# Patient Record
Sex: Female | Born: 1940 | Race: White | Hispanic: No | Smoking: Never smoker
Health system: Southern US, Community
[De-identification: ages and names within clinical notes are randomized; demographics above are authoritative.]

## PROBLEM LIST (undated history)

## (undated) DIAGNOSIS — K219 Gastro-esophageal reflux disease without esophagitis: Secondary | ICD-10-CM

## (undated) DIAGNOSIS — E7800 Pure hypercholesterolemia, unspecified: Secondary | ICD-10-CM

## (undated) DIAGNOSIS — I10 Essential (primary) hypertension: Secondary | ICD-10-CM

## (undated) DIAGNOSIS — M797 Fibromyalgia: Secondary | ICD-10-CM

## (undated) DIAGNOSIS — E119 Type 2 diabetes mellitus without complications: Secondary | ICD-10-CM

## (undated) DIAGNOSIS — R195 Other fecal abnormalities: Secondary | ICD-10-CM

## (undated) HISTORY — DX: Essential (primary) hypertension: I10

## (undated) HISTORY — DX: Fibromyalgia: M79.7

## (undated) HISTORY — PX: CARPAL TUNNEL RELEASE: SHX101

## (undated) HISTORY — DX: Gastro-esophageal reflux disease without esophagitis: K21.9

## (undated) HISTORY — PX: KNEE ARTHROSCOPY: SUR90

## (undated) HISTORY — PX: OTHER SURGICAL HISTORY: SHX169

## (undated) HISTORY — PX: NASAL FRACTURE SURGERY: SHX718

## (undated) HISTORY — DX: Type 2 diabetes mellitus without complications: E11.9

## (undated) HISTORY — PX: TUBAL LIGATION: SHX77

## (undated) HISTORY — DX: Pure hypercholesterolemia, unspecified: E78.00

## (undated) HISTORY — PX: LUMBAR EPIDURAL INJECTION: SHX1980

## (undated) HISTORY — PX: NECK SURGERY: SHX720

## (undated) HISTORY — DX: Other fecal abnormalities: R19.5

---

## 2015-07-14 ENCOUNTER — Encounter (INDEPENDENT_AMBULATORY_CARE_PROVIDER_SITE_OTHER): Payer: Self-pay | Admitting: *Deleted

## 2015-08-18 ENCOUNTER — Encounter (INDEPENDENT_AMBULATORY_CARE_PROVIDER_SITE_OTHER): Payer: Self-pay | Admitting: Internal Medicine

## 2015-08-18 ENCOUNTER — Telehealth (INDEPENDENT_AMBULATORY_CARE_PROVIDER_SITE_OTHER): Payer: Self-pay | Admitting: *Deleted

## 2015-08-18 ENCOUNTER — Encounter (INDEPENDENT_AMBULATORY_CARE_PROVIDER_SITE_OTHER): Payer: Self-pay | Admitting: *Deleted

## 2015-08-18 ENCOUNTER — Other Ambulatory Visit (INDEPENDENT_AMBULATORY_CARE_PROVIDER_SITE_OTHER): Payer: Self-pay | Admitting: Internal Medicine

## 2015-08-18 ENCOUNTER — Ambulatory Visit (INDEPENDENT_AMBULATORY_CARE_PROVIDER_SITE_OTHER): Payer: Medicare Other | Admitting: Internal Medicine

## 2015-08-18 VITALS — BP 150/80 | HR 80 | Temp 98.0°F | Ht 65.0 in | Wt 192.9 lb

## 2015-08-18 DIAGNOSIS — R197 Diarrhea, unspecified: Secondary | ICD-10-CM

## 2015-08-18 DIAGNOSIS — M797 Fibromyalgia: Secondary | ICD-10-CM | POA: Insufficient documentation

## 2015-08-18 DIAGNOSIS — I10 Essential (primary) hypertension: Secondary | ICD-10-CM | POA: Insufficient documentation

## 2015-08-18 DIAGNOSIS — M1990 Unspecified osteoarthritis, unspecified site: Secondary | ICD-10-CM | POA: Insufficient documentation

## 2015-08-18 DIAGNOSIS — K219 Gastro-esophageal reflux disease without esophagitis: Secondary | ICD-10-CM | POA: Diagnosis not present

## 2015-08-18 DIAGNOSIS — K589 Irritable bowel syndrome without diarrhea: Secondary | ICD-10-CM | POA: Diagnosis not present

## 2015-08-18 DIAGNOSIS — E7800 Pure hypercholesterolemia, unspecified: Secondary | ICD-10-CM | POA: Insufficient documentation

## 2015-08-18 DIAGNOSIS — E119 Type 2 diabetes mellitus without complications: Secondary | ICD-10-CM | POA: Insufficient documentation

## 2015-08-18 DIAGNOSIS — R1310 Dysphagia, unspecified: Secondary | ICD-10-CM

## 2015-08-18 DIAGNOSIS — E1159 Type 2 diabetes mellitus with other circulatory complications: Secondary | ICD-10-CM | POA: Insufficient documentation

## 2015-08-18 MED ORDER — DICYCLOMINE HCL 10 MG PO CAPS: 10.0000 mg | ORAL_CAPSULE | Freq: Two times a day (BID) | ORAL | Status: DC

## 2015-08-18 MED ORDER — OMEPRAZOLE 40 MG PO CPDR: 40.0000 mg | DELAYED_RELEASE_CAPSULE | Freq: Every day | ORAL | Status: DC

## 2015-08-18 NOTE — Progress Notes (Addendum)
   Subjective:    Patient ID: Theresa Hernandez, female    DOB: 1940-10-08, 74 y.o.   MRN: [id number]  HPI Referred to our office by Dr. Harmon Pier for diarrhea. She says she has to go to the [facility] to have a BM as soon as she eats. Stools are loose. Symptoms x 6 months.  She will have lower abdominal pain at times. She says sometimes she has to bend over it hurts so bad. The pain lasts a short time. He last colonoscopy was 5 yrs ago by Dr. West Carbo and she says it was normal.  Her appetite is good. Sometimes she says foods feel like they are lodging.  Dysphagia occurs on a daily basis. She has to eat slow. She does have acid reflux and takes Omeprazole for this. She says it helps some.  She usually has a BM 2-3 stools a day. She was on an antibiotic 1 1/2 weeks ago for a dental procedure.   Diabetic x 15 yrs. Blood sugars usually run 140 daily. Has been on Metformin for years.      Review of Systems      Past Medical History  Diagnosis Date  . Hypertension   . High cholesterol   . GERD (gastroesophageal reflux disease)   . Diabetes ([place])   . Fibromyalgia     Past Surgical History  Procedure Laterality Date  . Nasal fracture surgery    . Neck surgery    . Bust reduction    . Carpal tunnel release      both  . Knee arthroscopy    . Tubal ligation      No Known Allergies  No current outpatient prescriptions on file prior to visit.   No current facility-administered medications on file prior to visit.        Objective:   Physical Exam Blood pressure 150/80, pulse 80, temperature 98 F (36.7 C), height 5\' 5"  (1.651 m), weight 192 lb 14.4 oz (87.499 kg).  Alert and oriented. Skin warm and dry. Oral mucosa is moist.   . Sclera anicteric, conjunctivae is pink. Thyroid not enlarged. No cervical lymphadenopathy. Lungs clear. Heart regular rate and rhythm.  Abdomen is soft. Bowel sounds are positive. No hepatomegaly. No abdominal masses felt. No tenderness.  No edema to lower  extremities.         Assessment & Plan:  Diarrhea, urgency.  GERD/Dysphagia. Continue the Omeprazole. EGD/ED to rule out PUD and Stricture. CBC, CMET, GI pathogen. Rx for Dicyclomine 10mg  BID.  Will get colonoscopy report from Dr. Anastasio Auerbach office.  Will get colonoscopy report from Dr. Anastasio Auerbach office.   Rx

## 2015-08-18 NOTE — Patient Instructions (Signed)
EGD/ED. The risks and benefits such as perforation, bleeding, and infection were reviewed with the patient and is agreeable. 

## 2015-08-18 NOTE — Telephone Encounter (Signed)
.  Per Lelon Perla patient is to have labs drawn along with GI Pathogen.

## 2015-08-19 ENCOUNTER — Telehealth (INDEPENDENT_AMBULATORY_CARE_PROVIDER_SITE_OTHER): Payer: Self-pay | Admitting: Internal Medicine

## 2015-08-19 LAB — COMPREHENSIVE METABOLIC PANEL
ALT: 13 U/L (ref 6–29)
AST: 16 U/L (ref 10–35)
Albumin: 4.3 g/dL (ref 3.6–5.1)
Alkaline Phosphatase: 76 U/L (ref 33–130)
BUN: 16 mg/dL (ref 7–25)
CO2: 23 mmol/L (ref 20–31)
Calcium: 9.3 mg/dL (ref 8.6–10.4)
Chloride: 102 mmol/L (ref 98–110)
Creat: 0.83 mg/dL (ref 0.60–0.93)
GLUCOSE: 136 mg/dL — AB (ref 65–99)
Potassium: 4 mmol/L (ref 3.5–5.3)
SODIUM: 137 mmol/L (ref 135–146)
Total Bilirubin: 0.4 mg/dL (ref 0.2–1.2)
Total Protein: 6.7 g/dL (ref 6.1–8.1)

## 2015-08-19 LAB — CBC
HCT: 38.1 % (ref 36.0–46.0)
Hemoglobin: 12.4 g/dL (ref 12.0–15.0)
MCH: 27.1 pg (ref 26.0–34.0)
MCHC: 32.5 g/dL (ref 30.0–36.0)
MCV: 83.2 fL (ref 78.0–100.0)
MPV: 12.4 fL (ref 8.6–12.4)
PLATELETS: 285 10*3/uL (ref 150–400)
RBC: 4.58 MIL/uL (ref 3.87–5.11)
RDW: 14.5 % (ref 11.5–15.5)
WBC: 7.9 10*3/uL (ref 4.0–10.5)

## 2015-08-19 NOTE — Telephone Encounter (Signed)
Can not get it without patient signing a release. She left and one was not gotten.

## 2015-08-19 NOTE — Telephone Encounter (Signed)
Theresa Hernandez, did we get colonoscopy from Dr. West Carbo. If not, could u see if we can get it

## 2015-08-24 LAB — GASTROINTESTINAL PATHOGEN PANEL PCR
C. DIFFICILE TOX A/B, PCR: NOT DETECTED
CRYPTOSPORIDIUM, PCR: NOT DETECTED
Campylobacter, PCR: NOT DETECTED
E COLI (ETEC) LT/ST, PCR: NOT DETECTED
E COLI (STEC) STX1/STX2, PCR: NOT DETECTED
E COLI 0157, PCR: NOT DETECTED
GIARDIA LAMBLIA, PCR: NOT DETECTED
NOROVIRUS, PCR: NOT DETECTED
Rotavirus A, PCR: NOT DETECTED
Salmonella, PCR: NOT DETECTED
Shigella, PCR: NOT DETECTED

## 2015-09-05 ENCOUNTER — Ambulatory Visit (HOSPITAL_COMMUNITY)
Admission: RE | Admit: 2015-09-05 | Discharge: 2015-09-05 | Disposition: A | Discharge status: Home / Self Care | Payer: Medicare Other | Source: Ambulatory Visit | Attending: Internal Medicine | Admitting: Internal Medicine

## 2015-09-05 ENCOUNTER — Encounter (HOSPITAL_COMMUNITY)
Admission: RE | Disposition: A | Discharge status: Home / Self Care | Payer: Self-pay | Source: Ambulatory Visit | Attending: Internal Medicine

## 2015-09-05 ENCOUNTER — Encounter (HOSPITAL_COMMUNITY): Payer: Self-pay

## 2015-09-05 DIAGNOSIS — K219 Gastro-esophageal reflux disease without esophagitis: Secondary | ICD-10-CM | POA: Insufficient documentation

## 2015-09-05 DIAGNOSIS — K2960 Other gastritis without bleeding: Secondary | ICD-10-CM | POA: Insufficient documentation

## 2015-09-05 DIAGNOSIS — M797 Fibromyalgia: Secondary | ICD-10-CM | POA: Insufficient documentation

## 2015-09-05 DIAGNOSIS — R1310 Dysphagia, unspecified: Secondary | ICD-10-CM | POA: Diagnosis not present

## 2015-09-05 DIAGNOSIS — E7800 Pure hypercholesterolemia, unspecified: Secondary | ICD-10-CM | POA: Insufficient documentation

## 2015-09-05 DIAGNOSIS — I10 Essential (primary) hypertension: Secondary | ICD-10-CM | POA: Insufficient documentation

## 2015-09-05 DIAGNOSIS — Z794 Long term (current) use of insulin: Secondary | ICD-10-CM | POA: Insufficient documentation

## 2015-09-05 DIAGNOSIS — K317 Polyp of stomach and duodenum: Secondary | ICD-10-CM | POA: Diagnosis not present

## 2015-09-05 DIAGNOSIS — E119 Type 2 diabetes mellitus without complications: Secondary | ICD-10-CM | POA: Diagnosis not present

## 2015-09-05 DIAGNOSIS — K2950 Unspecified chronic gastritis without bleeding: Secondary | ICD-10-CM | POA: Diagnosis not present

## 2015-09-05 DIAGNOSIS — Z7982 Long term (current) use of aspirin: Secondary | ICD-10-CM | POA: Diagnosis not present

## 2015-09-05 DIAGNOSIS — Z79899 Other long term (current) drug therapy: Secondary | ICD-10-CM | POA: Diagnosis not present

## 2015-09-05 HISTORY — PX: ESOPHAGOGASTRODUODENOSCOPY: SHX5428

## 2015-09-05 HISTORY — PX: ESOPHAGEAL DILATION: SHX303

## 2015-09-05 LAB — GLUCOSE, CAPILLARY: Glucose-Capillary: 88 mg/dL (ref 65–99)

## 2015-09-05 SURGERY — EGD (ESOPHAGOGASTRODUODENOSCOPY)
Anesthesia: Moderate Sedation

## 2015-09-05 MED ORDER — SODIUM CHLORIDE 0.9 % IV SOLN
INTRAVENOUS | Status: DC
  Administered 2015-09-05: 09:00:00 via INTRAVENOUS

## 2015-09-05 MED ORDER — MIDAZOLAM HCL 5 MG/5ML IJ SOLN
INTRAMUSCULAR | Status: AC
  Filled 2015-09-05: qty 10

## 2015-09-05 MED ORDER — MEPERIDINE HCL 50 MG/ML IJ SOLN
INTRAMUSCULAR | Status: DC | PRN
  Administered 2015-09-05 (×2): 25 mg via INTRAVENOUS

## 2015-09-05 MED ORDER — MEPERIDINE HCL 50 MG/ML IJ SOLN
INTRAMUSCULAR | Status: AC
  Filled 2015-09-05: qty 1

## 2015-09-05 MED ORDER — BUTAMBEN-TETRACAINE-BENZOCAINE 2-2-14 % EX AERO
INHALATION_SPRAY | CUTANEOUS | Status: DC | PRN
  Administered 2015-09-05: 2 via TOPICAL

## 2015-09-05 MED ORDER — STERILE WATER FOR IRRIGATION IR SOLN
Status: DC | PRN
  Administered 2015-09-05: 11:00:00

## 2015-09-05 MED ORDER — MIDAZOLAM HCL 5 MG/5ML IJ SOLN
INTRAMUSCULAR | Status: DC | PRN
  Administered 2015-09-05: 2 mg via INTRAVENOUS
  Administered 2015-09-05: 1 mg via INTRAVENOUS
  Administered 2015-09-05: 2 mg via INTRAVENOUS

## 2015-09-05 NOTE — Op Note (Signed)
EGD PROCEDURE REPORT  PATIENT:  Theresa Hernandez  MR#:  [id number] Birthdate:  [birth year], 74 y.o., female Endoscopist:  Dr. Rogene Houston, MD Referred By:  Dr. Clinton Quant, MD Procedure Date: 09/05/2015  Procedure:   EGD with ED  Indications:   Patient is 74 year old Caucasian female with chronic GERD who presents with 6 month history of dysphagia to solids. She points lower sternum area site of bolus obstruction. She says heartburns well controlled with therapy. She takes 1-3 Aleve tablets per day for arthralgias.            Informed Consent:  The risks, benefits, alternatives & imponderables which include, but are not limited to, bleeding, infection, perforation, drug reaction and potential missed lesion have been reviewed.  The potential for biopsy, lesion removal, esophageal dilation, etc. have also been discussed.  Questions have been answered.  All parties agreeable.  Please see history & physical in medical record for more information.  Medications:  Demerol 50 mg IV Versed 5 mg IV Cetacaine spray topically for oropharyngeal anesthesia  Description of procedure:  The endoscope was introduced through the mouth and advanced to the second portion of the duodenum without difficulty or limitations. The mucosal surfaces were surveyed very carefully during advancement of the scope and upon withdrawal.  Findings:  Esophagus:  Mucosa of the esophagus was normal. GE junction was unremarkable. GEJ:  40 cm Stomach:  Stomach was empty and distended very well with insufflation. Folds in the proximal stomach were normal. Few small hyperplastic appearing polyps noted at gastric body and fundus. These were left alone. Multiple erosions noted involving antral mucosa. Pyloric channel was patent. Angularis was unremarkable. Duodenum:  Normal bulbar and post bulbar mucosa.  Therapeutic/Diagnostic Maneuvers Performed:   Esophagus was dilated by passing 56 [place] Maloney dilator to full  insertion. Esophageal mucosa was reexamined post dilation and no disruption noted.  Complications: None  EBL: None  Impression: No evidence of erosive esophagitis ring or stricture formation. Few small hyperplastic appearing polyps at gastric body and fundus. These were left alone. Erosive antral gastritis. Esophagus dilated by passing 30 French Maloney dilator to full insertion but no disruption noted to esophageal mucosa.  Comment: Since no structural abnormality noted to esophagus she may have esophageal motility disorder and would consider further workup if she remains with dysphagia.  Recommendations:  Standard instructions given. H. pylori serology. Patient advised to keep NSAID use to minimum. I will be contacting patient with results of blood test.  Zakery Normington U  09/05/2015  11:08 AM  CC: Dr. Clinton Quant, MD & Dr. Rayne Du ref. provider found

## 2015-09-05 NOTE — H&P (Signed)
Theresa Hernandez is an 74 y.o. female.   Chief Complaint: Patient is here for EGD and ED. HPI: Patient is 74 year old Caucasian female was chronic GERD and presents with 6 month history of solid food dysphagia. She is having difficulty just about every day with meats. She points lower sternum area soft bolus obstruction. Food bolus eventually goes on within few minutes. Heartburns well controlled with therapy. She denies abdominal pain melena or rectal bleeding. She was having postprandial diarrhea with urgency and is doing better with dicyclomine but she is only been on it for a few days.  Past Medical History  Diagnosis Date  . Hypertension   . High cholesterol   . GERD (gastroesophageal reflux disease)   . Diabetes ([place])   . Fibromyalgia     Past Surgical History  Procedure Laterality Date  . Nasal fracture surgery    . Neck surgery    . Bust reduction    . Carpal tunnel release      both  . Knee arthroscopy    . Tubal ligation      History reviewed. No pertinent family history. Social History:  reports that she has never smoked. She does not have any smokeless tobacco history on file. She reports that she does not drink alcohol or use illicit drugs.  Allergies: No Known Allergies  Medications Prior to Admission  Medication Sig Dispense Refill  . aspirin 81 MG tablet Take 81 mg by mouth daily.    . cholecalciferol (VITAMIN D) 1000 UNITS tablet Take 1,000 Units by mouth daily.    Marland Kitchen dicyclomine (BENTYL) 10 MG capsule Take 1 capsule (10 mg total) by mouth 2 (two) times daily before a meal. 60 capsule 4  . fenofibrate 160 MG tablet Take 160 mg by mouth daily.    Marland Kitchen glimepiride (AMARYL) 2 MG tablet Take 2 mg by mouth daily with breakfast.    . insulin glargine (LANTUS) 100 UNIT/ML injection Inject 80 Units into the skin daily.    . Insulin Glulisine (APIDRA [place]) Inject 14 Units into the skin.    Marland Kitchen losartan (COZAAR) 100 MG tablet Take 100 mg by mouth daily.    . metFORMIN (GLUCOPHAGE)  1000 MG tablet Take 1,000 mg by mouth 2 (two) times daily with a meal.    . metoprolol succinate (TOPROL-XL) 50 MG 24 hr tablet Take 50 mg by mouth 2 (two) times daily before a meal. Take with or immediately following a meal.    . Naproxen Sod-Diphenhydramine (ALEVE PM PO) Take by mouth.    Marland Kitchen omeprazole (PRILOSEC) 40 MG capsule Take 1 capsule (40 mg total) by mouth daily. 30 capsule 3  . rosuvastatin (CRESTOR) 20 MG tablet Take 20 mg by mouth daily.    Marland Kitchen venlafaxine (EFFEXOR) 75 MG tablet Take 75 mg by mouth 2 (two) times daily.    . Vitamin D, Ergocalciferol, (DRISDOL) 50000 UNITS CAPS capsule Take 50,000 Units by mouth every 7 (seven) days.      Results for orders placed or performed during the hospital encounter of 09/05/15 (from the past 48 hour(s))  Glucose, capillary     Status: None   Collection Time: 09/05/15  9:10 AM  Result Value Ref Range   Glucose-Capillary 88 65 - 99 mg/dL   No results found.  ROS  Blood pressure 157/65, pulse 57, temperature 98.8 F (37.1 C), temperature source Oral, resp. rate 18, height 5\' 5"  (1.651 m), weight 192 lb (87.091 kg), SpO2 97 %. Physical Exam  Constitutional:  She appears well-developed and well-nourished.  HENT:  Mouth/Throat: Oropharynx is clear and moist.  Eyes: Conjunctivae are normal. No scleral icterus.  Neck: No thyromegaly present.  Cardiovascular: Normal rate, regular rhythm and normal heart sounds.   No murmur heard. Respiratory: Effort normal and breath sounds normal.  GI: Soft. She exhibits no distension and no mass. There is no tenderness.  Musculoskeletal: She exhibits no edema.  Lymphadenopathy:    She has no cervical adenopathy.  Neurological: She is alert.  Skin: Skin is warm and dry.     Assessment/Plan Solid food dysphagia patient with chronic GERD. EGD with ED.  REHMAN,NAJEEB U 09/05/2015, 10:43 AM

## 2015-09-05 NOTE — Discharge Instructions (Signed)
Resume usual medications and diet. Keep Aleve use to minimum. Remember to chew thoroughly and eats slowly. No driving for 24 hours. Physician will call with results of blood test(H. pylori serology).   Gastrointestinal Endoscopy, Care After Refer to this sheet in the next few weeks. These instructions provide you with information on caring for yourself after your procedure. Your caregiver may also give you more specific instructions. Your treatment has been planned according to current medical practices, but problems sometimes occur. Call your caregiver if you have any problems or questions after your procedure. HOME CARE INSTRUCTIONS  If you were given medicine to help you relax (sedative), do not drive, operate machinery, or sign important documents for 24 hours.  Avoid alcohol and hot or warm beverages for the first 24 hours after the procedure.  Only take over-the-counter or prescription medicines for pain, discomfort, or fever as directed by your caregiver. You may resume taking your normal medicines unless your caregiver tells you otherwise. Ask your caregiver when you may resume taking medicines that may cause bleeding, such as aspirin, clopidogrel, or warfarin.  You may return to your normal diet and activities on the day after your procedure, or as directed by your caregiver. Walking may help to reduce any bloated feeling in your abdomen.  Drink enough fluids to keep your urine clear or pale yellow.  You may gargle with salt water if you have a sore throat. SEEK IMMEDIATE MEDICAL CARE IF:  You have severe nausea or vomiting.  You have severe abdominal pain, abdominal cramps that last longer than 6 hours, or abdominal swelling (distention).  You have severe shoulder or back pain.  You have trouble swallowing.  You have shortness of breath, your breathing is shallow, or you are breathing faster than normal.  You have a fever or a rapid heartbeat.  You vomit blood or  material that looks like coffee grounds.  You have bloody, black, or tarry stools. MAKE SURE YOU:  Understand these instructions.  Will watch your condition.  Will get help right away if you are not doing well or get worse.   This information is not intended to replace advice given to you by your health care provider. Make sure you discuss any questions you have with your health care provider.   Document Released: 05/01/2004 Document Revised: 10/08/2014 Document Reviewed: 12/18/2011 Elsevier Interactive Patient Education [employer].

## 2015-09-06 ENCOUNTER — Encounter (HOSPITAL_COMMUNITY): Payer: Self-pay | Admitting: Internal Medicine

## 2015-09-06 LAB — H. PYLORI ANTIBODY, IGG: H Pylori IgG: <0.9 U/mL (ref 0.0–0.8)

## 2016-09-11 ENCOUNTER — Encounter (INDEPENDENT_AMBULATORY_CARE_PROVIDER_SITE_OTHER): Payer: Self-pay

## 2016-09-11 ENCOUNTER — Encounter (INDEPENDENT_AMBULATORY_CARE_PROVIDER_SITE_OTHER): Payer: Self-pay | Admitting: Internal Medicine

## 2016-09-26 ENCOUNTER — Ambulatory Visit (INDEPENDENT_AMBULATORY_CARE_PROVIDER_SITE_OTHER): Payer: Medicare Other | Admitting: Internal Medicine

## 2017-05-02 ENCOUNTER — Encounter (INDEPENDENT_AMBULATORY_CARE_PROVIDER_SITE_OTHER): Payer: Self-pay | Admitting: Internal Medicine

## 2017-05-08 ENCOUNTER — Ambulatory Visit (INDEPENDENT_AMBULATORY_CARE_PROVIDER_SITE_OTHER): Payer: Medicare PPO | Admitting: Internal Medicine

## 2017-05-08 DIAGNOSIS — R195 Other fecal abnormalities: Secondary | ICD-10-CM | POA: Diagnosis not present

## 2017-05-08 DIAGNOSIS — K580 Irritable bowel syndrome with diarrhea: Secondary | ICD-10-CM

## 2017-05-09 ENCOUNTER — Encounter (INDEPENDENT_AMBULATORY_CARE_PROVIDER_SITE_OTHER): Payer: Self-pay | Admitting: Internal Medicine

## 2017-05-09 DIAGNOSIS — R195 Other fecal abnormalities: Secondary | ICD-10-CM | POA: Insufficient documentation

## 2017-05-09 HISTORY — DX: Other fecal abnormalities: R19.5

## 2017-05-09 MED ORDER — DICYCLOMINE HCL 10 MG PO CAPS: 10.0000 mg | ORAL_CAPSULE | Freq: Two times a day (BID) | ORAL | 4 refills | Status: DC

## 2017-05-09 NOTE — Progress Notes (Signed)
Subjective:    Patient ID: Theresa Hernandez, female    DOB: 16-May-1941, 76 y.o.   MRN: [id number]   HPI  Presents today with c/o she had pain in her mid abdomen.  Felt like she had a "knot" in her abdomen. As soon as she finished a meal she would have to go to have a BM.  She would have any where from 6-7 stools a day.  Symptoms started about 2 months ago and today she says they have resolved. She actually had thought about cancelling this appt.  She now is having 2-3 stools a day which is normal for her. She denies seening any melena or BRRB.  Stools are solid now.  She is 50% better. No GERD.  No recent antibiotics.  Per records she had a CT  Scan of the abdomen ? Date.  Hx of IBS in the past. Seen in 2016 for same. She was started on Dicyclomine 10mg  BID.   09/05/2015  EGD with ED  Indications:    Patient is 76 year old Caucasian female with chronic GERD who presents with 6 month history of dysphagia to solids. She points lower sternum area site of bolus obstruction. She says heartburns well controlled with therapy. She takes 1-3 Aleve tablets per day for arthralgias.                                                Impression: No evidence of erosive esophagitis ring or stricture formation. Few small hyperplastic appearing polyps at gastric body and fundus. These were left alone. Erosive antral gastritis. Esophagus dilated by passing 2 French Maloney dilator to full insertion but no disruption noted to esophageal mucosa.  Review of Systems Past Medical History:  Diagnosis Date  . Change in stool 05/09/2017  . Diabetes ([place])   . Fibromyalgia   . GERD (gastroesophageal reflux disease)   . High cholesterol   . Hypertension     Past Surgical History:  Procedure Laterality Date  . bust reduction    . CARPAL TUNNEL RELEASE     both  . ESOPHAGEAL DILATION N/A 09/05/2015   Procedure: ESOPHAGEAL DILATION;  Surgeon: Rogene Houston, MD;  Location: [facility];  Service: Endoscopy;   Laterality: N/A;  . ESOPHAGOGASTRODUODENOSCOPY N/A 09/05/2015   Procedure: ESOPHAGOGASTRODUODENOSCOPY (EGD);  Surgeon: Rogene Houston, MD;  Location: [facility];  Service: Endoscopy;  Laterality: N/A;  8:55  . KNEE ARTHROSCOPY    . NASAL FRACTURE SURGERY    . NECK SURGERY    . TUBAL LIGATION      No Known Allergies  Current Outpatient Prescriptions on File Prior to Visit  Medication Sig Dispense Refill  . aspirin 81 MG tablet Take 81 mg by mouth daily.    . cholecalciferol (VITAMIN D) 1000 UNITS tablet Take 1,000 Units by mouth daily.    . fenofibrate 160 MG tablet Take 160 mg by mouth daily.    . insulin glargine (LANTUS) 100 UNIT/ML injection Inject 70 Units into the skin daily.     Marland Kitchen losartan (COZAAR) 100 MG tablet Take 100 mg by mouth daily.    . metFORMIN (GLUCOPHAGE) 1000 MG tablet Take 1,000 mg by mouth 2 (two) times daily with a meal.    . metoprolol succinate (TOPROL-XL) 50 MG 24 hr tablet Take 50 mg by mouth 2 (two) times daily before  a meal. Take with or immediately following a meal.    . Naproxen Sod-Diphenhydramine (ALEVE PM PO) Take by mouth.    Marland Kitchen omeprazole (PRILOSEC) 40 MG capsule Take 1 capsule (40 mg total) by mouth daily. 30 capsule 3  . venlafaxine (EFFEXOR) 75 MG tablet Take 75 mg by mouth 2 (two) times daily.    Marland Kitchen dicyclomine (BENTYL) 10 MG capsule Take 1 capsule (10 mg total) by mouth 2 (two) times daily before a meal. (Patient not taking: Reported on 05/09/2017) 60 capsule 4  . glimepiride (AMARYL) 2 MG tablet Take 2 mg by mouth daily with breakfast.    . Insulin Glulisine (APIDRA [place]) Inject 14 Units into the skin.    . rosuvastatin (CRESTOR) 20 MG tablet Take 20 mg by mouth daily.     No current facility-administered medications on file prior to visit.         Objective:   Physical Exam Blood pressure 140/80, pulse 60, temperature 98.1 F (36.7 C), height 5\' 4"  (1.626 m), weight 172 lb 14.4 oz (78.4 kg).  Alert and oriented. Skin warm and dry. Oral  mucosa is moist.   . Sclera anicteric, conjunctivae is pink. Thyroid not enlarged. No cervical lymphadenopathy. Lungs clear. Heart regular rate and rhythm.  Abdomen is soft. Bowel sounds are positive. No hepatomegaly. No abdominal masses felt. No tenderness.            Assessment & Plan:  Change in stool. Stools are much better. Stools are formed. Having 1-2 stools a day. Am going to start her on Dicyclomine 10mg  BID.

## 2017-05-09 NOTE — Patient Instructions (Signed)
Rx for Dicyclomine 10mg  BID sent to her pharmacy

## 2020-10-13 LAB — HEMOGLOBIN A1C: Hemoglobin A1C: 10.7

## 2020-11-09 ENCOUNTER — Ambulatory Visit: Payer: Medicare HMO | Admitting: "Endocrinology

## 2020-11-09 ENCOUNTER — Encounter: Payer: Self-pay | Admitting: "Endocrinology

## 2020-11-09 ENCOUNTER — Other Ambulatory Visit: Payer: Self-pay

## 2020-11-09 VITALS — BP 114/54 | HR 60 | Ht 64.0 in | Wt 158.8 lb

## 2020-11-09 DIAGNOSIS — E1159 Type 2 diabetes mellitus with other circulatory complications: Secondary | ICD-10-CM

## 2020-11-09 DIAGNOSIS — I10 Essential (primary) hypertension: Secondary | ICD-10-CM | POA: Diagnosis not present

## 2020-11-09 DIAGNOSIS — E782 Mixed hyperlipidemia: Secondary | ICD-10-CM | POA: Diagnosis not present

## 2020-11-09 MED ORDER — GLIPIZIDE ER 2.5 MG PO TB24: 2.5000 mg | ORAL_TABLET | Freq: Every day | ORAL | 2 refills | Status: DC

## 2020-11-09 NOTE — Progress Notes (Signed)
Endocrinology Consult Note       11/09/2020, 9:00 PM   Subjective:    Patient ID: Theresa Hernandez, female    DOB: 11-05-1940.  Keerstin Hernandez is being seen in consultation for management of currently uncontrolled symptomatic diabetes requested by  Pomposini, Cherly Anderson, MD.   Past Medical History:  Diagnosis Date  . Change in stool 05/09/2017  . Diabetes ([place])   . Fibromyalgia   . GERD (gastroesophageal reflux disease)   . High cholesterol   . Hypertension     Past Surgical History:  Procedure Laterality Date  . bust reduction    . CARPAL TUNNEL RELEASE     both  . ESOPHAGEAL DILATION N/A 09/05/2015   Procedure: ESOPHAGEAL DILATION;  Surgeon: Rogene Houston, MD;  Location: [facility];  Service: Endoscopy;  Laterality: N/A;  . ESOPHAGOGASTRODUODENOSCOPY N/A 09/05/2015   Procedure: ESOPHAGOGASTRODUODENOSCOPY (EGD);  Surgeon: Rogene Houston, MD;  Location: [facility];  Service: Endoscopy;  Laterality: N/A;  8:55  . KNEE ARTHROSCOPY    . NASAL FRACTURE SURGERY    . NECK SURGERY    . TUBAL LIGATION      Social History   Socioeconomic History  . Marital status: Divorced    Spouse name: Not on file  . Number of children: Not on file  . Years of education: Not on file  . Highest education level: Not on file  Occupational History  . Not on file  Tobacco Use  . Smoking status: Never Smoker  . Smokeless tobacco: Never Used  Vaping Use  . Vaping Use: Never used  Substance and Sexual Activity  . Alcohol use: No    Alcohol/week: 0.0 standard drinks  . Drug use: No  . Sexual activity: Not on file  Other Topics Concern  . Not on file  Social History Narrative  . Not on file   Social Determinants of Health   Financial Resource Strain: Not on file  Food Insecurity: Not on file  Transportation Needs: Not on file  Physical Activity: Not on file  Stress: Not on file  Social Connections: Not on  file    History reviewed. No pertinent family history.  Outpatient Encounter Medications as of 11/09/2020  Medication Sig  . amLODipine (NORVASC) 5 MG tablet Take 1 tablet by mouth daily.  Marland Kitchen BIOTIN PO Take by mouth.  . clopidogrel (PLAVIX) 75 MG tablet Take 1 tablet by mouth daily.  Marland Kitchen ezetimibe (ZETIA) 10 MG tablet Take 1 tablet by mouth daily.  Marland Kitchen glipiZIDE (GLUCOTROL XL) 2.5 MG 24 hr tablet Take 1 tablet (2.5 mg total) by mouth daily with breakfast.  . Insulin Glargine (BASAGLAR KWIKPEN) 100 UNIT/ML Inject 60 Units into the skin at bedtime.  Marland Kitchen omeprazole (PRILOSEC) 20 MG capsule Take 1 capsule by mouth daily.  Marland Kitchen Apple Cider Vinegar 600 MG CAPS Take 1 capsule by mouth daily.  Marland Kitchen aspirin 81 MG tablet Take 81 mg by mouth daily.  Marland Kitchen atorvastatin (LIPITOR) 80 MG tablet Take 80 mg by mouth daily.  . cholecalciferol (VITAMIN D) 1000 UNITS tablet Take 2,000 Units by mouth daily.  . cyanocobalamin 1000  MCG tablet Take 1 tablet by mouth daily.  . fenofibrate 160 MG tablet Take 160 mg by mouth daily.  Marland Kitchen losartan (COZAAR) 100 MG tablet Take 100 mg by mouth daily.  . sotalol (BETAPACE) 80 MG tablet Take 1 tablet by mouth 2 (two) times daily.  . [DISCONTINUED] dicyclomine (BENTYL) 10 MG capsule Take 1 capsule (10 mg total) by mouth 2 (two) times daily.  . [DISCONTINUED] glimepiride (AMARYL) 2 MG tablet Take 2 mg by mouth daily with breakfast.  . [DISCONTINUED] insulin glargine (LANTUS) 100 UNIT/ML injection Inject 70 Units into the skin daily.   . [DISCONTINUED] Insulin Glulisine (APIDRA Brock) Inject 14 Units into the skin.  . [DISCONTINUED] metFORMIN (GLUCOPHAGE) 1000 MG tablet Take 1,000 mg by mouth 2 (two) times daily with a meal.  . [DISCONTINUED] metoprolol succinate (TOPROL-XL) 50 MG 24 hr tablet Take 50 mg by mouth 2 (two) times daily before a meal. Take with or immediately following a meal.  . [DISCONTINUED] Naproxen Sod-Diphenhydramine (ALEVE PM PO) Take by mouth.  . [DISCONTINUED] omeprazole  (PRILOSEC) 40 MG capsule Take 1 capsule (40 mg total) by mouth daily.  . [DISCONTINUED] rivaroxaban (XARELTO) 20 MG TABS tablet Take 20 mg by mouth daily with supper.  . [DISCONTINUED] rosuvastatin (CRESTOR) 20 MG tablet Take 20 mg by mouth daily.  . [DISCONTINUED] venlafaxine (EFFEXOR) 75 MG tablet Take 75 mg by mouth 2 (two) times daily.   No facility-administered encounter medications on file as of 11/09/2020.    ALLERGIES: No Known Allergies  VACCINATION STATUS:  There is no immunization history on file for this patient.  Diabetes She presents for her initial diabetic visit. She has type 2 diabetes mellitus. Onset time: She was diagnosed at approximate age of 80 years. Her disease course has been worsening. There are no hypoglycemic associated symptoms. Pertinent negatives for hypoglycemia include no confusion, headaches, pallor or seizures. Associated symptoms include blurred vision, fatigue, foot paresthesias, polydipsia and polyuria. Pertinent negatives for diabetes include no chest pain and no polyphagia. There are no hypoglycemic complications. Symptoms are worsening. 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 Risk factors for coronary artery disease include dyslipidemia, diabetes mellitus, post-menopausal, sedentary lifestyle and tobacco exposure. Current diabetic treatment includes insulin injections (She is currently taking Basaglar 80-85 units daily.). Her weight is decreasing steadily (She reports recent loss of 40 pounds of weight.). She is following a generally unhealthy diet. When asked about meal planning, she reported none. She has not had a previous visit with a dietitian. She rarely participates in exercise. Her home blood glucose trend is fluctuating minimally. Her breakfast blood glucose range is generally 180-200 mg/dl. Her lunch blood glucose range is generally 180-200 mg/dl. Her dinner blood glucose range is generally 180-200 mg/dl. Her bedtime blood glucose range  is generally 180-200 mg/dl. Her overall blood glucose range is 180-200 mg/dl. (She brought her meter showing average blood glucose of 184-294 over the last 90 days.  Her recent A1c was 10.7%.  Her prior A1c measurements were 10.9%, 11.2%.  He did not report recent hypoglycemia.  She does not tolerate Metformin.) An ACE inhibitor/angiotensin II receptor blocker is being taken. Eye exam is current.  Hyperlipidemia This is a chronic problem. The current episode started more than 1 year ago. Exacerbating diseases include diabetes. Associated symptoms include myalgias. Pertinent negatives include no chest pain or shortness of breath. Current antihyperlipidemic treatment includes statins. Risk factors for coronary artery disease include dyslipidemia, diabetes mellitus, hypertension, a sedentary lifestyle and post-menopausal.  Hypertension  This is a chronic problem. The current episode started more than 1 year ago. Associated symptoms include blurred vision. Pertinent negatives include no chest pain, headaches, palpitations or shortness of breath. Risk factors for coronary artery disease include dyslipidemia, diabetes mellitus, smoking/tobacco exposure, sedentary lifestyle and post-menopausal state. Past treatments include angiotensin blockers. 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     Review of Systems  Constitutional: Positive for fatigue. Negative for chills, fever and unexpected weight change.  HENT: Negative for trouble swallowing and voice change.   Eyes: Positive for blurred vision. Negative for visual disturbance.  Respiratory: Negative for cough, shortness of breath and wheezing.   Cardiovascular: Negative for chest pain, palpitations and leg swelling.  Gastrointestinal: Negative for diarrhea, nausea and vomiting.  Endocrine: Positive for polydipsia and polyuria. Negative for cold intolerance, heat intolerance and polyphagia.  Musculoskeletal: Positive for arthralgias, back pain  and myalgias.  Skin: Negative for color change, pallor, rash and wound.  Neurological: Negative for seizures and headaches.  Psychiatric/Behavioral: Negative for confusion and suicidal ideas.    Objective:    Vitals with BMI 11/09/2020 05/09/2017 09/05/2015  Height 5\' 4"  5\' 4"  -  Weight 158 lbs 13 oz 172 lbs 14 oz -  BMI 16.10 96.0 -  Systolic 454 098 119  Diastolic 54 80 69  Pulse 60 60 60    BP (!) 114/54   Pulse 60   Ht 5\' 4"  (1.626 m)   Wt 158 lb 12.8 oz (72 kg)   BMI 27.26 kg/m   Wt Readings from Last 3 Encounters:  11/09/20 158 lb 12.8 oz (72 kg)  05/09/17 172 lb 14.4 oz (78.4 kg)  09/05/15 192 lb (87.1 kg)     Physical Exam Constitutional:      Appearance: She is well-developed.  HENT:     Head: Normocephalic and atraumatic.  Eyes:     Extraocular Movements: EOM normal.  Neck:     Thyroid: No thyromegaly.     Trachea: No tracheal deviation.  Cardiovascular:     Rate and Rhythm: Normal rate.  Pulmonary:     Effort: Pulmonary effort is normal.  Abdominal:     General: Bowel sounds are normal.     Palpations: Abdomen is soft.     Tenderness: There is no abdominal tenderness. There is no guarding.  Musculoskeletal:        General: No edema. Normal range of motion.     Cervical back: Normal range of motion and neck supple.  Skin:    General: Skin is warm and dry.     Coloration: Skin is not pale.     Findings: No erythema or rash.  Neurological:     Mental Status: She is alert and oriented to person, place, and time.     Cranial Nerves: No cranial nerve deficit.     Coordination: Coordination normal.     Deep Tendon Reflexes: Reflexes are normal and symmetric.     Comments: Diminished monofilament testing on bilateral feet.  Psychiatric:        Mood and Affect: Mood and affect normal.        Judgment: Judgment normal.    CMP ( most recent) CMP     Component Value Date/Time   NA 137 08/18/2015 1449   K 4.0 08/18/2015 1449   CL 102 08/18/2015 1449    CO2 23 08/18/2015 1449   GLUCOSE 136 (H) 08/18/2015 1449   BUN 16 08/18/2015 1449   CREATININE 0.83 08/18/2015 1449  CALCIUM 9.3 08/18/2015 1449   PROT 6.7 08/18/2015 1449   ALBUMIN 4.3 08/18/2015 1449   AST 16 08/18/2015 1449   ALT 13 08/18/2015 1449   ALKPHOS 76 08/18/2015 1449   BILITOT 0.4 08/18/2015 1449     Diabetic Labs (most recent): Lab Results  Component Value Date   HGBA1C 10.7 10/13/2020     Assessment & Plan:   1. DM type 2 causing vascular disease ([place])  - Latifa Noble has currently uncontrolled symptomatic type 2 DM since  80 years of age,  with most recent A1c of 10.7 %. Recent labs reviewed.  Her previous measurements were 10.9% and 11.2%.  This patient was accompanied by her friend Hassan Rowan to clinic.  Hassan Rowan is offering to help her. - I had a long discussion with her about the progressive nature of diabetes and the pathology behind its complications. -her diabetes is complicated by coronary artery disease, CVA, several comorbidities.  And she remains at exceedingly high risk for more acute and chronic complications which include CAD, CVA, CKD, retinopathy, and neuropathy. These are all discussed in detail with her.  - I have counseled her on diet  and weight management  by adopting a carbohydrate restricted/protein rich diet. Patient is encouraged to switch to  unprocessed or minimally processed     complex starch and increased protein intake (animal or plant source), fruits, and vegetables. -  she is advised to stick to a routine mealtimes to eat 3 meals  a day and avoid unnecessary snacks ( to snack only to correct hypoglycemia).   - she acknowledges that there is a room for improvement in her food and drink choices. - Suggestion is made for her to avoid simple carbohydrates  from her diet including Cakes, Sweet Desserts, Ice Cream, Soda (diet and regular), Sweet Tea, Candies, Chips, Cookies, Store Bought Juices, Alcohol in Excess of  1-2 drinks a day, Artificial  Sweeteners,  Coffee Creamer, and "Sugar-free" Products. This will help patient to have more stable blood glucose profile and potentially avoid unintended weight gain.  - she will be scheduled with Jearld Fenton, RDN, CDE for diabetes education.  - I have approached her with the following individualized plan to manage  her diabetes and patient agrees:   -In light of her presentation with chronic glycemic burden, she will likely require intensive treatment with multiple daily injections of insulin in order for her to achieve and maintain control of diabetes to target.   -However, patient appears to be overwhelmed to give her that instruction at this visit . -To avoid inadvertent hypoglycemia, she is advised to lower her Basaglar to 60 units nightly, and patient is approached to initiate strict monitoring of blood glucose  -4 times a day-before meals and at bedtime and return in 10 days with her meter and logs for evaluation. - she is warned not to take insulin without proper monitoring per orders. - Adjustment parameters are given to her for hypo and hyperglycemia in writing. - she is encouraged to call clinic for blood glucose levels less than 70 or above 300 mg /dl. - she has normal renal function, however patient reports intolerance to Metformin.    -If she presents with significant postprandial glycemic profile, she will be considered for rapid acting insulin.   -She may benefit from glipizide remission.  I discussed and started low-dose glipizide 2.5 mg XL p.o. daily at breakfast. - Specific targets for  A1c;  LDL, HDL,  and Triglycerides were discussed with the  patient.  2) Blood Pressure /Hypertension:  her blood pressure is  controlled to target.   she is advised to continue her current medications including losartan 100 mg p.o. daily with breakfast . 3) Lipids/Hyperlipidemia: No recent lipid panel to review.  She is advised to continue atorvastatin 80 mg p.o. nightly, ezetimibe 10 mg p.o.  daily.  Side effects and precautions discussed with her.    4)  Weight/Diet:  Body mass index is 27.26 kg/m.  -    she reports recent unintentional weight loss of 40 pounds, not a candidate for major weight loss. I discussed with her the fact that loss of 5 - 10% of her  current body weight will have the most impact on her diabetes management.  Exercise, and detailed carbohydrates information provided  -  detailed on discharge instructions.  5) Chronic Care/Health Maintenance:  -she  is on ACEI/ARB and Statin medications and  is encouraged to initiate and continue to follow up with Ophthalmology, Dentist,  Podiatrist at least yearly or according to recommendations, and advised to   stay away from smoking. I have recommended yearly flu vaccine and pneumonia vaccine at least every 5 years; moderate intensity exercise for up to 150 minutes weekly; and  sleep for at least 7 hours a day.  - she is  advised to maintain close follow up with Pomposini, Cherly Anderson, MD for primary care needs, as well as her other providers for optimal and coordinated care.   - Time spent in this patient care: 60 min, of which > 50% was spent in  counseling  her about her chronically uncontrolled type 2 diabetes, hyperlipidemia, hypertension and the rest reviewing her blood glucose logs , discussing her hypoglycemia and hyperglycemia episodes, reviewing her current and  previous labs / studies  ( including abstraction from other facilities) and medications  doses and developing a  long term treatment plan based on the latest standards of care/ guidelines; and documenting her care.    Please refer to Patient Instructions for Blood Glucose Monitoring and Insulin/Medications Dosing Guide"  in media tab for additional information. Please  also refer to " Patient Self Inventory" in the Media  tab for reviewed elements of pertinent patient history.  Dickey Gave participated in the discussions, expressed understanding, and voiced  agreement with the above plans.  All questions were answered to her satisfaction. she is encouraged to contact clinic should she have any questions or concerns prior to her return visit.   Follow up plan: - Return in about 10 days (around 11/19/2020) for F/U with Meter and Logs Only - no Labs.  Glade Lloyd, MD [facility] [address] [place], [place] [address] Phone: [phone]  Fax: [phone]    11/09/2020, 9:00 PM  This note was partially dictated with voice recognition software. Similar sounding words can be transcribed inadequately or may not  be corrected upon review.

## 2020-11-09 NOTE — Patient Instructions (Signed)

## 2020-11-17 ENCOUNTER — Other Ambulatory Visit: Payer: Self-pay

## 2020-11-17 ENCOUNTER — Encounter: Payer: Medicare HMO | Attending: "Endocrinology | Admitting: Nutrition

## 2020-11-17 DIAGNOSIS — IMO0002 Reserved for concepts with insufficient information to code with codable children: Secondary | ICD-10-CM

## 2020-11-17 DIAGNOSIS — E7800 Pure hypercholesterolemia, unspecified: Secondary | ICD-10-CM | POA: Insufficient documentation

## 2020-11-17 DIAGNOSIS — E1165 Type 2 diabetes mellitus with hyperglycemia: Secondary | ICD-10-CM

## 2020-11-17 DIAGNOSIS — E1159 Type 2 diabetes mellitus with other circulatory complications: Secondary | ICD-10-CM | POA: Diagnosis present

## 2020-11-17 DIAGNOSIS — E782 Mixed hyperlipidemia: Secondary | ICD-10-CM | POA: Insufficient documentation

## 2020-11-17 DIAGNOSIS — I10 Essential (primary) hypertension: Secondary | ICD-10-CM | POA: Diagnosis present

## 2020-11-17 NOTE — Patient Instructions (Addendum)
Goals  Follow myplate  Eat three balanced meals per day Eat 30 g CHO at each meal Cut out snacks Drink only water Increase lower carb vegetables. Walk more in house as able. Get FBS less than 150 before meals and less than 180 at bedtime. Keep blood sugar log and food diary and bring at each visit. Get A1C down to 7%

## 2020-11-17 NOTE — Progress Notes (Signed)
  Medical Nutrition Therapy:  Appt start time: 7414 end time:  1630. This visit was completed via telephone due to the COVID-19 pandemic.   I spoke with Theresa Hernandez and verified that I was speaking with the correct person with two patient identifiers (full name and date of birth).   I discussed the limitations related to this kind of visit and the patient is willing to proceed.   Assessment:  Primary concerns today: Diabetes Type 2. Lives by herself. Sees Dr. Dorris Fetch, endocrinology. Lives by herself. FBS  Range 70-90 mg/dl,     Before lunch 70-160's mg/dl     Before dinner 66-194 mg/dl Bedtime 160-200's mg/dl.  Currently taking: Glipizide 5 mg a day and Lantus 50 units at night.  Lab Results  Component Value Date   HGBA1C 10.7 10/13/2020   CMP Latest Ref Rng & Units 08/18/2015  Glucose 65 - 99 mg/dL 136(H)  BUN 7 - 25 mg/dL 16  Creatinine 0.60 - 0.93 mg/dL 0.83  Sodium 135 - 146 mmol/L 137  Potassium 3.5 - 5.3 mmol/L 4.0  Chloride 98 - 110 mmol/L 102  CO2 20 - 31 mmol/L 23  Calcium 8.6 - 10.4 mg/dL 9.3  Total Protein 6.1 - 8.1 g/dL 6.7  Total Bilirubin 0.2 - 1.2 mg/dL 0.4  Alkaline Phos 33 - 130 U/L 76  AST 10 - 35 U/L 16  ALT 6 - 29 U/L 13   Preferred Learning Style:  Auditory  Visual  Hands on   Learning Readiness:   Ready  Change in progress   MEDICATIONS: see chart   DIETARY INTAKE:  24-hr recall:  B ( AM): Egg and 3 sausage balls and coffee  sweet n low L) chicken general chicken with brown rice,water D) baked potato, water  Usual physical activity:  Estimated energy needs: 1200 calories 135 g carbohydrates 90 g protein 33 g fat  Progress Towards Goal(s):  In progress.   Nutritional Diagnosis:  NB-1.1 Food and nutrition-related knowledge deficit As related to Diabetes Type 2  As evidenced by A1C 10.5%..    Intervention: Nutrition and Diabetes education provided on My Plate, CHO counting, meal planning, portion sizes, timing of meals, avoiding  snacks between meals unless having a low blood sugar, target ranges for A1C and blood sugars, signs/symptoms and treatment of hyper/hypoglycemia, monitoring blood sugars, taking medications as prescribed, benefits of exercising 30 minutes per day and prevention of complications of DM.  Goals  Follow myplate  Eat three balanced meals per day Eat 30 g CHO at each meal Cut out snacks Drink only water Increase lower carb vegetables. Walk more in house as able. Get FBS less than 150 before meals and less than 180 at bedtime. Keep blood sugar log and food diary and bring at each visit. Get A1C down to 7%   Teaching Method Utilized:  Visual Auditory Hands on  Handouts given during visit include:  Emailed: MyPlate, Meal planning, diabetes instructions, s/s of hyper/hypoglycemia and treatment  Low Cholesterol diet.   Barriers to learning/adherence to lifestyle change: none  Demonstrated degree of understanding via:  Teach Back   Monitoring/Evaluation:  Dietary intake, exercise, blood sugars and body weight in 1 month(s).

## 2020-11-23 ENCOUNTER — Encounter: Payer: Self-pay | Admitting: "Endocrinology

## 2020-11-23 ENCOUNTER — Other Ambulatory Visit: Payer: Self-pay

## 2020-11-23 ENCOUNTER — Ambulatory Visit (INDEPENDENT_AMBULATORY_CARE_PROVIDER_SITE_OTHER): Payer: Medicare HMO | Admitting: "Endocrinology

## 2020-11-23 VITALS — BP 135/61 | HR 61 | Ht 64.0 in | Wt 162.0 lb

## 2020-11-23 DIAGNOSIS — E782 Mixed hyperlipidemia: Secondary | ICD-10-CM | POA: Diagnosis not present

## 2020-11-23 DIAGNOSIS — I10 Essential (primary) hypertension: Secondary | ICD-10-CM

## 2020-11-23 DIAGNOSIS — E1159 Type 2 diabetes mellitus with other circulatory complications: Secondary | ICD-10-CM | POA: Diagnosis not present

## 2020-11-23 MED ORDER — GLIPIZIDE ER 5 MG PO TB24: 5.0000 mg | ORAL_TABLET | Freq: Every day | ORAL | 1 refills | Status: DC

## 2020-11-23 NOTE — Patient Instructions (Signed)

## 2020-11-23 NOTE — Progress Notes (Signed)
11/23/2020, 4:46 PM  Endocrinology follow-up note   Subjective:    Patient ID: Theresa Hernandez, female    DOB: 11/11/1940.  Theresa Hernandez is being seen in follow up after she was seen in  consultation for management of currently uncontrolled symptomatic diabetes requested by  Pomposini, Cherly Anderson, MD.   Past Medical History:  Diagnosis Date  . Change in stool 05/09/2017  . Diabetes ([place])   . Fibromyalgia   . GERD (gastroesophageal reflux disease)   . High cholesterol   . Hypertension     Past Surgical History:  Procedure Laterality Date  . bust reduction    . CARPAL TUNNEL RELEASE     both  . ESOPHAGEAL DILATION N/A 09/05/2015   Procedure: ESOPHAGEAL DILATION;  Surgeon: Rogene Houston, MD;  Location: [facility];  Service: Endoscopy;  Laterality: N/A;  . ESOPHAGOGASTRODUODENOSCOPY N/A 09/05/2015   Procedure: ESOPHAGOGASTRODUODENOSCOPY (EGD);  Surgeon: Rogene Houston, MD;  Location: [facility];  Service: Endoscopy;  Laterality: N/A;  8:55  . KNEE ARTHROSCOPY    . NASAL FRACTURE SURGERY    . NECK SURGERY    . TUBAL LIGATION      Social History   Socioeconomic History  . Marital status: Divorced    Spouse name: Not on file  . Number of children: Not on file  . Years of education: Not on file  . Highest education level: Not on file  Occupational History  . Not on file  Tobacco Use  . Smoking status: Never Smoker  . Smokeless tobacco: Never Used  Vaping Use  . Vaping Use: Never used  Substance and Sexual Activity  . Alcohol use: No    Alcohol/week: 0.0 standard drinks  . Drug use: No  . Sexual activity: Not on file  Other Topics Concern  . Not on file  Social History Narrative  . Not on file   Social Determinants of Health   Financial Resource Strain: Not on file  Food Insecurity: Not on file  Transportation Needs: Not on file  Physical Activity: Not on file  Stress: Not on  file  Social Connections: Not on file    History reviewed. No pertinent family history.  Outpatient Encounter Medications as of 11/23/2020  Medication Sig  . amLODipine (NORVASC) 5 MG tablet Take 1 tablet by mouth daily.  Marland Kitchen Apple Cider Vinegar 600 MG CAPS Take 1 capsule by mouth daily.  Marland Kitchen aspirin 81 MG tablet Take 81 mg by mouth daily.  Marland Kitchen atorvastatin (LIPITOR) 80 MG tablet Take 80 mg by mouth daily.  Marland Kitchen BIOTIN PO Take by mouth.  . cholecalciferol (VITAMIN D) 1000 UNITS tablet Take 2,000 Units by mouth daily.  . clopidogrel (PLAVIX) 75 MG tablet Take 1 tablet by mouth daily.  . cyanocobalamin 1000 MCG tablet Take 1 tablet by mouth daily.  Marland Kitchen ezetimibe (ZETIA) 10 MG tablet Take 1 tablet by mouth daily.  . fenofibrate 160 MG tablet Take 160 mg by mouth daily.  Marland Kitchen glipiZIDE (GLUCOTROL XL) 5 MG 24 hr tablet Take 1 tablet (5 mg total) by mouth daily with breakfast.  .  Insulin Glargine (BASAGLAR KWIKPEN) 100 UNIT/ML Inject 50 Units into the skin at bedtime.  Marland Kitchen losartan (COZAAR) 100 MG tablet Take 100 mg by mouth daily.  Marland Kitchen omeprazole (PRILOSEC) 20 MG capsule Take 1 capsule by mouth daily.  . sotalol (BETAPACE) 80 MG tablet Take 1 tablet by mouth 2 (two) times daily.  . [DISCONTINUED] glipiZIDE (GLUCOTROL XL) 2.5 MG 24 hr tablet Take 1 tablet (2.5 mg total) by mouth daily with breakfast.   No facility-administered encounter medications on file as of 11/23/2020.    ALLERGIES: No Known Allergies  VACCINATION STATUS:  There is no immunization history on file for this patient.  Diabetes She presents for her follow-up diabetic visit. She has type 2 diabetes mellitus. Onset time: She was diagnosed at approximate age of 37 years. Her disease course has been improving. There are no hypoglycemic associated symptoms. Pertinent negatives for hypoglycemia include no confusion, headaches, pallor or seizures. Associated symptoms include blurred vision, fatigue and foot paresthesias. Pertinent negatives for  diabetes include no chest pain, no polydipsia, no polyphagia and no polyuria. There are no hypoglycemic complications. Symptoms are improving. 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 Risk factors for coronary artery disease include dyslipidemia, diabetes mellitus, post-menopausal, sedentary lifestyle and tobacco exposure. Current diabetic treatment includes insulin injections. Her weight is fluctuating minimally. She is following a generally unhealthy diet. When asked about meal planning, she reported none. She has not had a previous visit with a dietitian. She rarely participates in exercise. Her home blood glucose trend is decreasing steadily. Her breakfast blood glucose range is generally 130-140 mg/dl. Her lunch blood glucose range is generally 140-180 mg/dl. Her dinner blood glucose range is generally 140-180 mg/dl. Her bedtime blood glucose range is generally 140-180 mg/dl. Her overall blood glucose range is 140-180 mg/dl. (She brought her meter showing average blood glucose of 150 in the last 14 days, significantly improving from 294 over the last 90 days.  Her recent A1c was 10.7%.  Her prior A1c measurements were 10.9%, 11.2%.  She has tight fasting BG. ) An ACE inhibitor/angiotensin II receptor blocker is being taken. Eye exam is current.  Hyperlipidemia This is a chronic problem. The current episode started more than 1 year ago. Exacerbating diseases include diabetes. Associated symptoms include myalgias. Pertinent negatives include no chest pain or shortness of breath. Current antihyperlipidemic treatment includes statins. Risk factors for coronary artery disease include dyslipidemia, diabetes mellitus, hypertension, a sedentary lifestyle and post-menopausal.  Hypertension This is a chronic problem. The current episode started more than 1 year ago. Associated symptoms include blurred vision. Pertinent negatives include no chest pain, headaches, palpitations or shortness of breath.  Risk factors for coronary artery disease include dyslipidemia, diabetes mellitus, smoking/tobacco exposure, sedentary lifestyle and post-menopausal state. Past treatments include angiotensin blockers. 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     Review of Systems  Constitutional: Positive for fatigue. Negative for chills, fever and unexpected weight change.  HENT: Negative for trouble swallowing and voice change.   Eyes: Positive for blurred vision. Negative for visual disturbance.  Respiratory: Negative for cough, shortness of breath and wheezing.   Cardiovascular: Negative for chest pain, palpitations and leg swelling.  Gastrointestinal: Negative for diarrhea, nausea and vomiting.  Endocrine: Negative for cold intolerance, heat intolerance, polydipsia, polyphagia and polyuria.  Musculoskeletal: Positive for arthralgias, back pain and myalgias.  Skin: Negative for color change, pallor, rash and wound.  Neurological: Negative for seizures and headaches.  Psychiatric/Behavioral: Negative for confusion and suicidal ideas.  Objective:    Vitals with BMI 11/23/2020 11/09/2020 05/09/2017  Height 5\' 4"  5\' 4"  5\' 4"   Weight 162 lbs 158 lbs 13 oz 172 lbs 14 oz  BMI 27.79 24.26 83.4  Systolic 196 222 979  Diastolic 61 54 80  Pulse 61 60 60    BP 135/61   Pulse 61   Ht 5\' 4"  (1.626 m)   Wt 162 lb (73.5 kg)   BMI 27.81 kg/m   Wt Readings from Last 3 Encounters:  11/23/20 162 lb (73.5 kg)  11/09/20 158 lb 12.8 oz (72 kg)  05/09/17 172 lb 14.4 oz (78.4 kg)     Physical Exam Constitutional:      Appearance: She is well-developed.  HENT:     Head: Normocephalic and atraumatic.  Neck:     Thyroid: No thyromegaly.     Trachea: No tracheal deviation.  Cardiovascular:     Rate and Rhythm: Normal rate.  Pulmonary:     Effort: Pulmonary effort is normal.  Abdominal:     General: Bowel sounds are normal.     Palpations: Abdomen is soft.     Tenderness: There is no  abdominal tenderness. There is no guarding.  Musculoskeletal:        General: Normal range of motion.     Cervical back: Normal range of motion and neck supple.  Skin:    General: Skin is warm and dry.     Coloration: Skin is not pale.     Findings: No erythema or rash.  Neurological:     Mental Status: She is alert and oriented to person, place, and time.     Cranial Nerves: No cranial nerve deficit.     Coordination: Coordination normal.     Deep Tendon Reflexes: Reflexes are normal and symmetric.     Comments: Diminished monofilament testing on bilateral feet.  Psychiatric:        Judgment: Judgment normal.    CMP ( most recent) CMP     Component Value Date/Time   NA 137 08/18/2015 1449   K 4.0 08/18/2015 1449   CL 102 08/18/2015 1449   CO2 23 08/18/2015 1449   GLUCOSE 136 (H) 08/18/2015 1449   BUN 16 08/18/2015 1449   CREATININE 0.83 08/18/2015 1449   CALCIUM 9.3 08/18/2015 1449   PROT 6.7 08/18/2015 1449   ALBUMIN 4.3 08/18/2015 1449   AST 16 08/18/2015 1449   ALT 13 08/18/2015 1449   ALKPHOS 76 08/18/2015 1449   BILITOT 0.4 08/18/2015 1449     Diabetic Labs (most recent): Lab Results  Component Value Date   HGBA1C 10.7 10/13/2020     Assessment & Plan:   1. DM type 2 causing vascular disease ([place])  - Theresa Hernandez has currently uncontrolled symptomatic type 2 DM since  80 years of age,  with most recent A1c of 10.7 %. Recent labs reviewed.  Her previous measurements were 10.9% and 11.2%.  This patient was accompanied by her friend Theresa Hernandez to clinic.  Her friend Theresa Hernandez is offering to help her.  She brought her meter showing average blood glucose of 150 in the last 14 days, significantly improving from 294 over the last 90 days.  Her recent A1c was 10.7%.  Her prior A1c measurements were 10.9%, 11.2%.  She has tight fasting BG.   - I had a long discussion with her about the progressive nature of diabetes and the pathology behind its complications. -her  diabetes is complicated by coronary artery  disease, CVA, several comorbidities.  And she remains at exceedingly high risk for more acute and chronic complications which include CAD, CVA, CKD, retinopathy, and neuropathy. These are all discussed in detail with her.  - I have counseled her on diet  and weight management  by adopting a carbohydrate restricted/protein rich diet. Patient is encouraged to switch to  unprocessed or minimally processed     complex starch and increased protein intake (animal or plant source), fruits, and vegetables. -  she is advised to stick to a routine mealtimes to eat 3 meals  a day and avoid unnecessary snacks ( to snack only to correct hypoglycemia).   - she acknowledges that there is a room for improvement in her food and drink choices. - Suggestion is made for her to avoid simple carbohydrates  from her diet including Cakes, Sweet Desserts, Ice Cream, Soda (diet and regular), Sweet Tea, Candies, Chips, Cookies, Store Bought Juices, Alcohol in Excess of  1-2 drinks a day, Artificial Sweeteners,  Coffee Creamer, and "Sugar-free" Products, Lemonade. This will help patient to have more stable blood glucose profile and potentially avoid unintended weight gain.   - she will be scheduled with Jearld Fenton, RDN, CDE for diabetes education.  - I have approached her with the following individualized plan to manage  her diabetes and patient agrees:   -In light of her presentation with tightening fasting glycemic profile, she will need a lower dose of basal insulin.  Based on her near target glycemic profile postprandially, she will not need prandial insulin for now.    -To avoid inadvertent hypoglycemia, she is advised to lower her basal insulin Basaglar to 50 units nightly, he is willing to continue monitoring blood glucose twice a day-daily before breakfast and at bedtime.   - she is warned not to take insulin without proper monitoring per orders.  - she is encouraged to  call clinic for blood glucose levels less than 70 or above 300 mg /dl. - she has normal renal function, however patient reports intolerance to Metformin.    -She has tolerated and benefiting from low-dose glipizide.  I discussed and increase her glipizide to 5 mg XL p.o. daily at breakfast.  - Specific targets for  A1c;  LDL, HDL,  and Triglycerides were discussed with the patient.  2) Blood Pressure /Hypertension: Her blood pressure is controlled to target. she is advised to continue her current medications including losartan 100 mg p.o. daily with breakfast . 3) Lipids/Hyperlipidemia: No recent lipid panel to review.  She is advised to continue atorvastatin 80 mg p.o. nightly.  She is also on ezetimibe 10 mg p.o. daily.    Side effects and precautions discussed with her.    4)  Weight/Diet:  Body mass index is 27.81 kg/m.  -    she reports recent unintentional weight loss of 40 pounds, not a candidate for major weight loss. I discussed with her the fact that loss of 5 - 10% of her  current body weight will have the most impact on her diabetes management.  Exercise, and detailed carbohydrates information provided  -  detailed on discharge instructions.  5) Chronic Care/Health Maintenance:  -she  is on ACEI/ARB and Statin medications and  is encouraged to initiate and continue to follow up with Ophthalmology, Dentist,  Podiatrist at least yearly or according to recommendations, and advised to   stay away from smoking. I have recommended yearly flu vaccine and pneumonia vaccine at least every 5 years; moderate  intensity exercise for up to 150 minutes weekly; and  sleep for at least 7 hours a day.  POCT ABI Results 11/23/20  Her ABIs normal today November 23, 2020. Right ABI: 1.02      left ABI: 1.1  Right leg systolic / diastolic: 379/43 mmHg Left leg systolic / diastolic: 276/14 mmHg  Arm systolic / diastolic: 709/29 mmHG Her next ABI is due in February 2027, or sooner if needed.   - she  is  advised to maintain close follow up with Pomposini, Cherly Anderson, MD for primary care needs, as well as her other providers for optimal and coordinated care.   - Time spent on this patient care encounter:  45 min, of which > 50% was spent in  counseling and the rest reviewing her blood glucose logs , discussing her hypoglycemia and hyperglycemia episodes, reviewing her current and  previous labs / studies  ( including abstraction from other facilities) and medications  doses and developing a  long term treatment plan and documenting her care.   Please refer to Patient Instructions for Blood Glucose Monitoring and Insulin/Medications Dosing Guide"  in media tab for additional information. Please  also refer to " Patient Self Inventory" in the Media  tab for reviewed elements of pertinent patient history.  Theresa Hernandez participated in the discussions, expressed understanding, and voiced agreement with the above plans.  All questions were answered to her satisfaction. she is encouraged to contact clinic should she have any questions or concerns prior to her return visit.    Follow up plan: - Return in about 9 weeks (around 01/25/2021) for Bring Meter and Logs- A1c in Office.  Glade Lloyd, MD [facility] [address] [place], [place] [address] Phone: [phone]  Fax: [phone]    11/23/2020, 4:46 PM  This note was partially dictated with voice recognition software. Similar sounding words can be transcribed inadequately or may not  be corrected upon review.

## 2020-12-08 ENCOUNTER — Encounter: Payer: Self-pay | Admitting: Nutrition

## 2020-12-28 ENCOUNTER — Ambulatory Visit: Payer: Medicare HMO | Admitting: Nutrition

## 2021-01-02 ENCOUNTER — Telehealth: Payer: Self-pay

## 2021-01-02 MED ORDER — GLIPIZIDE ER 5 MG PO TB24: 5.0000 mg | ORAL_TABLET | Freq: Every day | ORAL | 1 refills | Status: DC

## 2021-01-02 NOTE — Telephone Encounter (Signed)
RX sent

## 2021-01-02 NOTE — Telephone Encounter (Signed)
Pt said that at her last visit Dr Dorris Fetch wanted her to change to 5mg  for her Glipizide. She needs a new RX sent to [facility] on [address] in [place], please advise

## 2021-01-18 LAB — BASIC METABOLIC PANEL
BUN: 27 — AB (ref 4–21)
CO2: 29 — AB (ref 13–22)
Chloride: 108 (ref 99–108)
Creatinine: 1 (ref 0.5–1.1)
Glucose: 65
Potassium: 4 (ref 3.4–5.3)
Sodium: 141 (ref 137–147)

## 2021-01-18 LAB — HEPATIC FUNCTION PANEL
ALT: 30 (ref 7–35)
AST: 29 (ref 13–35)
Alkaline Phosphatase: 61 (ref 25–125)
Bilirubin, Total: 0.4

## 2021-01-18 LAB — COMPREHENSIVE METABOLIC PANEL
Albumin: 3.9 (ref 3.5–5.0)
Calcium: 9.5 (ref 8.7–10.7)
GFR calc Af Amer: >60
GFR calc non Af Amer: 55

## 2021-01-18 LAB — LIPID PANEL
Cholesterol: 90 (ref 0–200)
HDL: 28 — AB (ref 35–70)
LDL Cholesterol: 44
Triglycerides: 92 (ref 40–160)

## 2021-01-18 LAB — TSH: TSH: 3.07 (ref 0.41–5.90)

## 2021-01-24 ENCOUNTER — Ambulatory Visit (INDEPENDENT_AMBULATORY_CARE_PROVIDER_SITE_OTHER): Payer: Medicare HMO | Admitting: "Endocrinology

## 2021-01-24 ENCOUNTER — Other Ambulatory Visit: Payer: Self-pay

## 2021-01-24 ENCOUNTER — Encounter: Payer: Self-pay | Admitting: "Endocrinology

## 2021-01-24 VITALS — BP 130/58 | HR 56 | Ht 64.0 in | Wt 163.0 lb

## 2021-01-24 DIAGNOSIS — I10 Essential (primary) hypertension: Secondary | ICD-10-CM

## 2021-01-24 DIAGNOSIS — E782 Mixed hyperlipidemia: Secondary | ICD-10-CM

## 2021-01-24 DIAGNOSIS — E1159 Type 2 diabetes mellitus with other circulatory complications: Secondary | ICD-10-CM

## 2021-01-24 LAB — POCT GLYCOSYLATED HEMOGLOBIN (HGB A1C): HbA1c, POC (controlled diabetic range): 6.5 % (ref 0.0–7.0)

## 2021-01-24 NOTE — Progress Notes (Signed)
01/24/2021, 4:23 PM  Endocrinology follow-up note   Subjective:    Patient ID: Theresa Hernandez, female    DOB: [birth year].  Kimira Colonna is being seen in follow up after she was seen in  consultation for management of currently uncontrolled symptomatic diabetes requested by  Pomposini, Cherly Anderson, MD.   Past Medical History:  Diagnosis Date  . Change in stool 05/09/2017  . Diabetes ([place])   . Fibromyalgia   . GERD (gastroesophageal reflux disease)   . High cholesterol   . Hypertension     Past Surgical History:  Procedure Laterality Date  . bust reduction    . CARPAL TUNNEL RELEASE     both  . ESOPHAGEAL DILATION N/A 09/05/2015   Procedure: ESOPHAGEAL DILATION;  Surgeon: Rogene Houston, MD;  Location: [facility];  Service: Endoscopy;  Laterality: N/A;  . ESOPHAGOGASTRODUODENOSCOPY N/A 09/05/2015   Procedure: ESOPHAGOGASTRODUODENOSCOPY (EGD);  Surgeon: Rogene Houston, MD;  Location: [facility];  Service: Endoscopy;  Laterality: N/A;  8:55  . KNEE ARTHROSCOPY    . NASAL FRACTURE SURGERY    . NECK SURGERY    . TUBAL LIGATION      Social History   Socioeconomic History  . Marital status: Divorced    Spouse name: Not on file  . Number of children: Not on file  . Years of education: Not on file  . Highest education level: Not on file  Occupational History  . Not on file  Tobacco Use  . Smoking status: Never Smoker  . Smokeless tobacco: Never Used  Vaping Use  . Vaping Use: Never used  Substance and Sexual Activity  . Alcohol use: No    Alcohol/week: 0.0 standard drinks  . Drug use: No  . Sexual activity: Not on file  Other Topics Concern  . Not on file  Social History Narrative  . Not on file   Social Determinants of Health   Financial Resource Strain: Not on file  Food Insecurity: Not on file  Transportation Needs: Not on file  Physical Activity: Not on file  Stress: Not on  file  Social Connections: Not on file    History reviewed. No pertinent family history.  Outpatient Encounter Medications as of 01/24/2021  Medication Sig  . amLODipine (NORVASC) 5 MG tablet Take 1 tablet by mouth daily.  Marland Kitchen Apple Cider Vinegar 600 MG CAPS Take 1 capsule by mouth daily.  Marland Kitchen aspirin 81 MG tablet Take 81 mg by mouth daily.  Marland Kitchen atorvastatin (LIPITOR) 80 MG tablet Take 80 mg by mouth daily.  Marland Kitchen BIOTIN PO Take by mouth.  . cholecalciferol (VITAMIN D) 1000 UNITS tablet Take 2,000 Units by mouth daily.  . clopidogrel (PLAVIX) 75 MG tablet Take 1 tablet by mouth daily.  . cyanocobalamin 1000 MCG tablet Take 1 tablet by mouth daily.  Marland Kitchen ezetimibe (ZETIA) 10 MG tablet Take 1 tablet by mouth daily.  . fenofibrate 160 MG tablet Take 160 mg by mouth daily.  Marland Kitchen glipiZIDE (GLUCOTROL XL) 5 MG 24 hr tablet Take 1 tablet (5 mg total) by mouth daily with breakfast.  .  Insulin Glargine (BASAGLAR KWIKPEN) 100 UNIT/ML Inject 30 Units into the skin at bedtime.  Marland Kitchen losartan (COZAAR) 100 MG tablet Take 100 mg by mouth daily.  Marland Kitchen omeprazole (PRILOSEC) 20 MG capsule Take 1 capsule by mouth daily.  . sotalol (BETAPACE) 80 MG tablet Take 1 tablet by mouth 2 (two) times daily.   No facility-administered encounter medications on file as of 01/24/2021.    ALLERGIES: No Known Allergies  VACCINATION STATUS:  There is no immunization history on file for this patient.  Diabetes She presents for her follow-up diabetic visit. She has type 2 diabetes mellitus. Onset time: She was diagnosed at approximate age of 80 years. Her disease course has been improving. There are no hypoglycemic associated symptoms. Pertinent negatives for hypoglycemia include no confusion, headaches, pallor or seizures. Associated symptoms include blurred vision, fatigue and foot paresthesias. Pertinent negatives for diabetes include no chest pain, no polydipsia, no polyphagia and no polyuria. There are no hypoglycemic complications.  Symptoms are improving. 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 Risk factors for coronary artery disease include dyslipidemia, diabetes mellitus, post-menopausal, sedentary lifestyle and tobacco exposure. Current diabetic treatment includes insulin injections. Her weight is fluctuating minimally. She is following a generally unhealthy diet. When asked about meal planning, she reported none. She has not had a previous visit with a dietitian. She rarely participates in exercise. Her home blood glucose trend is decreasing steadily. Her breakfast blood glucose range is generally 90-110 mg/dl. Her bedtime blood glucose range is generally 140-180 mg/dl. Her overall blood glucose range is 140-180 mg/dl. (She brought her meter and logs showing average blood glucose of 142 for the last 30 days.  Her point-of-care A1c is 6.5%, overall improving from 11.2%.  She has some tightening of fasting glycemic profile including mild hypoglycemia.  She failed to call clinic for reporting this readings.    ) An ACE inhibitor/angiotensin II receptor blocker is being taken. Eye exam is current.  Hyperlipidemia This is a chronic problem. The current episode started more than 1 year ago. Exacerbating diseases include diabetes. Associated symptoms include myalgias. Pertinent negatives include no chest pain or shortness of breath. Current antihyperlipidemic treatment includes statins. Risk factors for coronary artery disease include dyslipidemia, diabetes mellitus, hypertension, a sedentary lifestyle and post-menopausal.  Hypertension This is a chronic problem. The current episode started more than 1 year ago. Associated symptoms include blurred vision. Pertinent negatives include no chest pain, headaches, palpitations or shortness of breath. Risk factors for coronary artery disease include dyslipidemia, diabetes mellitus, smoking/tobacco exposure, sedentary lifestyle and post-menopausal state. Past treatments include  angiotensin blockers. 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     Review of Systems  Constitutional: Positive for fatigue. Negative for chills, fever and unexpected weight change.  HENT: Negative for trouble swallowing and voice change.   Eyes: Positive for blurred vision. Negative for visual disturbance.  Respiratory: Negative for cough, shortness of breath and wheezing.   Cardiovascular: Negative for chest pain, palpitations and leg swelling.  Gastrointestinal: Negative for diarrhea, nausea and vomiting.  Endocrine: Negative for cold intolerance, heat intolerance, polydipsia, polyphagia and polyuria.  Musculoskeletal: Positive for arthralgias, back pain and myalgias.  Skin: Negative for color change, pallor, rash and wound.  Neurological: Negative for seizures and headaches.  Psychiatric/Behavioral: Negative for confusion and suicidal ideas.    Objective:    Vitals with BMI 01/24/2021 11/23/2020 11/09/2020  Height 5\' 4"  5\' 4"  5\' 4"   Weight 163 lbs 162 lbs 158 lbs 13 oz  BMI 27.97 51.02 58.52  Systolic 778 242 353  Diastolic 58 61 54  Pulse 56 61 60    BP (!) 130/58   Pulse (!) 56   Ht 5\' 4"  (1.626 m)   Wt 163 lb (73.9 kg)   BMI 27.98 kg/m   Wt Readings from Last 3 Encounters:  01/24/21 163 lb (73.9 kg)  11/23/20 162 lb (73.5 kg)  11/09/20 158 lb 12.8 oz (72 kg)     Physical Exam Constitutional:      Appearance: She is well-developed.  HENT:     Head: Normocephalic and atraumatic.  Neck:     Thyroid: No thyromegaly.     Trachea: No tracheal deviation.  Cardiovascular:     Rate and Rhythm: Normal rate.  Pulmonary:     Effort: Pulmonary effort is normal.  Abdominal:     General: Bowel sounds are normal.     Palpations: Abdomen is soft.     Tenderness: There is no abdominal tenderness. There is no guarding.  Musculoskeletal:        General: Normal range of motion.     Cervical back: Normal range of motion and neck supple.  Skin:    General: Skin  is warm and dry.     Coloration: Skin is not pale.     Findings: No erythema or rash.  Neurological:     Mental Status: She is alert and oriented to person, place, and time.     Cranial Nerves: No cranial nerve deficit.     Coordination: Coordination normal.     Deep Tendon Reflexes: Reflexes are normal and symmetric.     Comments: Diminished monofilament testing on bilateral feet.  Psychiatric:        Judgment: Judgment normal.    CMP ( most recent) CMP     Component Value Date/Time   NA 141 01/18/2021 0000   K 4.0 01/18/2021 0000   CL 108 01/18/2021 0000   CO2 29 (A) 01/18/2021 0000   GLUCOSE 136 (H) 08/18/2015 1449   BUN 27 (A) 01/18/2021 0000   CREATININE 1.0 01/18/2021 0000   CREATININE 0.83 08/18/2015 1449   CALCIUM 9.5 01/18/2021 0000   PROT 6.7 08/18/2015 1449   ALBUMIN 3.9 01/18/2021 0000   AST 29 01/18/2021 0000   ALT 30 01/18/2021 0000   ALKPHOS 61 01/18/2021 0000   BILITOT 0.4 08/18/2015 1449   GFRNONAA 55 01/18/2021 0000   GFRAA >60 01/18/2021 0000     Diabetic Labs (most recent): Lab Results  Component Value Date   HGBA1C 6.5 01/24/2021   HGBA1C 10.7 10/13/2020     Assessment & Plan:   1. DM type 2 causing vascular disease ([place])  - Samra Pesch has currently uncontrolled symptomatic type 2 DM since  80 years of age,  with most recent A1c of 10.7 %. Recent labs reviewed.  Her previous measurements were 10.9% and 11.2%.  This patient was accompanied by her friend Hassan Rowan to clinic.  Her friend Hassan Rowan is offering to help her.  She brought her meter and logs showing average blood glucose of 142 for the last 30 days.  Her point-of-care A1c is 6.5%, overall improving from 11.2%.  She has some tightening of fasting glycemic profile including mild hypoglycemia.  She failed to call clinic for reporting this readings.     - I had a long discussion with her about the progressive nature of diabetes and the pathology behind its complications. -her diabetes is  complicated by coronary  artery disease, CVA, several comorbidities.  And she remains at exceedingly high risk for more acute and chronic complications which include CAD, CVA, CKD, retinopathy, and neuropathy. These are all discussed in detail with her.  - I have counseled her on diet  and weight management  by adopting a carbohydrate restricted/protein rich diet. Patient is encouraged to switch to  unprocessed or minimally processed     complex starch and increased protein intake (animal or plant source), fruits, and vegetables. -  she is advised to stick to a routine mealtimes to eat 3 meals  a day and avoid unnecessary snacks ( to snack only to correct hypoglycemia).   - she acknowledges that there is a room for improvement in her food and drink choices. - Suggestion is made for her to avoid simple carbohydrates  from her diet including Cakes, Sweet Desserts, Ice Cream, Soda (diet and regular), Sweet Tea, Candies, Chips, Cookies, Store Bought Juices, Alcohol in Excess of  1-2 drinks a day, Artificial Sweeteners,  Coffee Creamer, and "Sugar-free" Products, Lemonade. This will help patient to have more stable blood glucose profile and potentially avoid unintended weight gain.   - she will be scheduled with Jearld Fenton, RDN, CDE for diabetes education.  - I have approached her with the following individualized plan to manage  her diabetes and patient agrees:   -In light of her presentation with tightening fasting glycemic profile, she will need a lower dose of basal insulin.  I discussed and lowered her Basaglar to 30 units nightly, she is willing to continue monitoring blood glucose twice a day-daily before breakfast and at bedtime.   - she is warned not to take insulin without proper monitoring per orders.  - she is encouraged to call clinic for blood glucose levels less than 70 or above 200 mg /dl. - she has normal renal function, however patient reports intolerance to Metformin.    -She has  tolerated and benefiting from low-dose glipizide.  I discussed and increase her glipizide to 5 mg XL p.o. daily at breakfast.  - Specific targets for  A1c;  LDL, HDL,  and Triglycerides were discussed with the patient.  2) Blood Pressure /Hypertension: .  Her blood pressure is controlled to target. she is advised to continue her current medications including losartan 100 mg p.o. daily with breakfast . 3) Lipids/Hyperlipidemia: No recent lipid panel to review.  She is advised to continue atorvastatin 80 mg nightly.  She is also on ezetimibe 10 mg p.o. daily.    Side effects and precautions discussed with her.    4)  Weight/Diet:  Body mass index is 27.98 kg/m.  -    she reports recent unintentional weight loss of 40 pounds, not a candidate for major weight loss. I discussed with her the fact that loss of 5 - 10% of her  current body weight will have the most impact on her diabetes management.  Exercise, and detailed carbohydrates information provided  -  detailed on discharge instructions.  5) Chronic Care/Health Maintenance:  -she  is on ACEI/ARB and Statin medications and  is encouraged to initiate and continue to follow up with Ophthalmology, Dentist,  Podiatrist at least yearly or according to recommendations, and advised to   stay away from smoking. I have recommended yearly flu vaccine and pneumonia vaccine at least every 5 years; moderate intensity exercise for up to 150 minutes weekly; and  sleep for at least 7 hours a day.  Point-of-care ABI for PAD was  normal in February 2022.  Her test will be repeated in February 2027 or sooner if needed.  - she is  advised to maintain close follow up with Pomposini, Cherly Anderson, MD for primary care needs, as well as her other providers for optimal and coordinated care.   I spent 41 minutes in the care of the patient today including review of labs from [facility], Lipids, Thyroid Function, Hematology (current and previous including abstractions from other  facilities); face-to-face time discussing  her blood glucose readings/logs, discussing hypoglycemia and hyperglycemia episodes and symptoms, medications doses, her options of short and long term treatment based on the latest standards of care / guidelines;  discussion about incorporating lifestyle medicine;  and documenting the encounter.    Please refer to Patient Instructions for Blood Glucose Monitoring and Insulin/Medications Dosing Guide"  in media tab for additional information. Please  also refer to " Patient Self Inventory" in the Media  tab for reviewed elements of pertinent patient history.  Dickey Gave participated in the discussions, expressed understanding, and voiced agreement with the above plans.  All questions were answered to her satisfaction. she is encouraged to contact clinic should she have any questions or concerns prior to her return visit.    Follow up plan: - Return in about 4 months (around 05/26/2021) for Bring Meter and Logs- A1c in Office.  Glade Lloyd, MD [facility] [address] [place],  [address] Phone: [phone]  Fax: [phone]    01/24/2021, 4:23 PM  This note was partially dictated with voice recognition software. Similar sounding words can be transcribed inadequately or may not  be corrected upon review.

## 2021-01-24 NOTE — Patient Instructions (Signed)
                                     Advice for Weight Management  -For most of us the best way to lose weight is by diet management. Generally speaking, diet management means consuming less calories intentionally which over time brings about progressive weight loss.  This can be achieved more effectively by restricting carbohydrate consumption to the minimum possible.  So, it is critically important to know your numbers: how much calorie you are consuming and how much calorie you need. More importantly, our carbohydrates sources should be unprocessed or minimally processed complex starch food items.   Sometimes, it is important to balance nutrition by increasing protein intake (animal or plant source), fruits, and vegetables.  -Sticking to a routine mealtime to eat 3 meals a day and avoiding unnecessary snacks is shown to have a big role in weight control. Under normal circumstances, the only time we lose real weight is when we are hungry, so allow hunger to take place- hunger means no food between meal times, only water.  It is not advisable to starve.   -It is better to avoid simple carbohydrates including: Cakes, Sweet Desserts, Ice Cream, Soda (diet and regular), Sweet Tea, Candies, Chips, Cookies, Store Bought Juices, Alcohol in Excess of  1-2 drinks a day, Lemonade,  Artificial Sweeteners, Doughnuts, Coffee Creamers, "Sugar-free" Products, etc, etc.  This is not a complete list.....    -Consulting with certified diabetes educators is proven to provide you with the most accurate and current information on diet.  Also, you may be  interested in discussing diet options/exchanges , we can schedule a visit with Theresa Hernandez, RDN, CDE for individualized nutrition education.  -Exercise: If you are able: 30 -60 minutes a day ,4 days a week, or 150 minutes a week.  The longer the better.  Combine stretch, strength, and aerobic activities.  If you were told in the  past that you have high risk for cardiovascular diseases, you may seek evaluation by your heart doctor prior to initiating moderate to intense exercise programs.                                  Additional Care Considerations for Diabetes   -Diabetes  is a chronic disease.  The most important care consideration is regular follow-up with your diabetes care provider with the goal being avoiding or delaying its complications and to take advantage of advances in medications and technology.    -Type 2 diabetes is known to coexist with other important comorbidities such as high blood pressure and high cholesterol.  It is critical to control not only the diabetes but also the high blood pressure and high cholesterol to minimize and delay the risk of complications including coronary artery disease, stroke, amputations, blindness, etc.    - Studies showed that people with diabetes will benefit from a class of medications known as ACE inhibitors and statins.  Unless there are specific reasons not to be on these medications, the standard of care is to consider getting one from these groups of medications at an optimal doses.  These medications are generally considered safe and proven to help protect the heart and the kidneys.    - People with diabetes are encouraged to initiate and maintain regular follow-up with eye doctors, foot   doctors, dentists , and if necessary heart and kidney doctors.     - It is highly recommended that people with diabetes quit smoking or stay away from smoking, and get yearly  flu vaccine and pneumonia vaccine at least every 5 years.  One other important lifestyle recommendation is to ensure adequate sleep - at least 6-7 hours of uninterrupted sleep at night.  -Exercise: If you are able: 30 -60 minutes a day, 4 days a week, or 150 minutes a week.  The longer the better.  Combine stretch, strength, and aerobic activities.  If you were told in the past that you have high risk for  cardiovascular diseases, you may seek evaluation by your heart doctor prior to initiating moderate to intense exercise programs.          

## 2021-01-25 ENCOUNTER — Ambulatory Visit: Payer: Medicare HMO | Admitting: "Endocrinology

## 2021-01-26 ENCOUNTER — Ambulatory Visit: Payer: Medicare HMO | Admitting: "Endocrinology

## 2021-05-29 ENCOUNTER — Other Ambulatory Visit: Payer: Self-pay

## 2021-05-29 ENCOUNTER — Encounter: Payer: Self-pay | Admitting: "Endocrinology

## 2021-05-29 ENCOUNTER — Ambulatory Visit: Payer: Medicare HMO | Admitting: "Endocrinology

## 2021-05-29 VITALS — BP 129/72 | HR 57 | Ht 64.0 in | Wt 163.0 lb

## 2021-05-29 DIAGNOSIS — E1159 Type 2 diabetes mellitus with other circulatory complications: Secondary | ICD-10-CM

## 2021-05-29 LAB — POCT GLYCOSYLATED HEMOGLOBIN (HGB A1C): HbA1c, POC (controlled diabetic range): 8.6 % — AB (ref 0.0–7.0)

## 2021-05-29 NOTE — Patient Instructions (Signed)

## 2021-05-29 NOTE — Progress Notes (Signed)
05/29/2021, 2:36 PM  Endocrinology follow-up note   Subjective:    Patient ID: Theresa Hernandez, female    DOB: Feb 07, 1941.  Theresa Hernandez is being seen in follow up after she was seen in  consultation for management of currently uncontrolled symptomatic diabetes requested by  Pomposini, Cherly Anderson, MD.   Past Medical History:  Diagnosis Date   Change in stool 05/09/2017   Diabetes ([place])    Fibromyalgia    GERD (gastroesophageal reflux disease)    High cholesterol    Hypertension     Past Surgical History:  Procedure Laterality Date   bust reduction     CARPAL TUNNEL RELEASE     both   ESOPHAGEAL DILATION N/A 09/05/2015   Procedure: ESOPHAGEAL DILATION;  Surgeon: Rogene Houston, MD;  Location: [facility];  Service: Endoscopy;  Laterality: N/A;   ESOPHAGOGASTRODUODENOSCOPY N/A 09/05/2015   Procedure: ESOPHAGOGASTRODUODENOSCOPY (EGD);  Surgeon: Rogene Houston, MD;  Location: [facility];  Service: Endoscopy;  Laterality: N/A;  8:55   KNEE ARTHROSCOPY     NASAL FRACTURE SURGERY     NECK SURGERY     TUBAL LIGATION      Social History   Socioeconomic History   Marital status: Divorced    Spouse name: Not on file   Number of children: Not on file   Years of education: Not on file   Highest education level: Not on file  Occupational History   Not on file  Tobacco Use   Smoking status: Never   Smokeless tobacco: Never  Vaping Use   Vaping Use: Never used  Substance and Sexual Activity   Alcohol use: No    Alcohol/week: 0.0 standard drinks   Drug use: No   Sexual activity: Not on file  Other Topics Concern   Not on file  Social History Narrative   Not on file   Social Determinants of Health   Financial Resource Strain: Not on file  Food Insecurity: Not on file  Transportation Needs: Not on file  Physical Activity: Not on file  Stress: Not on file  Social Connections: Not on file     History reviewed. No pertinent family history.  Outpatient Encounter Medications as of 05/29/2021  Medication Sig   amLODipine (NORVASC) 5 MG tablet Take 1 tablet by mouth daily.   Apple Cider Vinegar 600 MG CAPS Take 1 capsule by mouth daily.   aspirin 81 MG tablet Take 81 mg by mouth daily.   atorvastatin (LIPITOR) 80 MG tablet Take 80 mg by mouth daily.   BIOTIN PO Take by mouth.   cholecalciferol (VITAMIN D) 1000 UNITS tablet Take 2,000 Units by mouth daily.   clopidogrel (PLAVIX) 75 MG tablet Take 1 tablet by mouth daily.   cyanocobalamin 1000 MCG tablet Take 1 tablet by mouth daily.   ezetimibe (ZETIA) 10 MG tablet Take 1 tablet by mouth daily.   fenofibrate 160 MG tablet Take 160 mg by mouth daily.   glipiZIDE (GLUCOTROL XL) 5 MG 24 hr tablet Take 1 tablet (5 mg total) by mouth daily with breakfast.   Insulin Glargine (  BASAGLAR KWIKPEN) 100 UNIT/ML Inject 36 Units into the skin at bedtime.   losartan (COZAAR) 100 MG tablet Take 100 mg by mouth daily.   omeprazole (PRILOSEC) 20 MG capsule Take 1 capsule by mouth daily.   sotalol (BETAPACE) 80 MG tablet Take 1 tablet by mouth 2 (two) times daily.   No facility-administered encounter medications on file as of 05/29/2021.    ALLERGIES: Allergies  Allergen Reactions   Ciprofloxacin Nausea And Vomiting    VACCINATION STATUS:  There is no immunization history on file for this patient.  Diabetes She presents for her follow-up diabetic visit. She has type 2 diabetes mellitus. Onset time: She was diagnosed at approximate age of 41 years. Her disease course has been worsening. There are no hypoglycemic associated symptoms. Pertinent negatives for hypoglycemia include no confusion, headaches, pallor or seizures. Associated symptoms include blurred vision, fatigue and foot paresthesias. Pertinent negatives for diabetes include no chest pain, no polydipsia, no polyphagia and no polyuria. There are no hypoglycemic complications.  Symptoms are worsening. 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 Risk factors for coronary artery disease include dyslipidemia, diabetes mellitus, post-menopausal, sedentary lifestyle and tobacco exposure. Current diabetic treatment includes insulin injections. Her weight is fluctuating minimally. She is following a generally unhealthy diet. When asked about meal planning, she reported none. She has not had a previous visit with a dietitian. She rarely participates in exercise. Her home blood glucose trend is increasing steadily. Her breakfast blood glucose range is generally 140-180 mg/dl. Her bedtime blood glucose range is generally >200 mg/dl. Her overall blood glucose range is >200 mg/dl. (She brought her meter and logs showing average blood glucose of 180 for the last month, point-of-care A1c of 8.6% increasing from 6.5% during her last visit.  She did not have any major hypoglycemia documented.   She failed to call clinic for reporting hyperglycemia.  ) An ACE inhibitor/angiotensin II receptor blocker is being taken. Eye exam is current.  Hyperlipidemia This is a chronic problem. The current episode started more than 1 year ago. Exacerbating diseases include diabetes. Associated symptoms include myalgias. Pertinent negatives include no chest pain or shortness of breath. Current antihyperlipidemic treatment includes statins. Risk factors for coronary artery disease include dyslipidemia, diabetes mellitus, hypertension, a sedentary lifestyle and post-menopausal.  Hypertension This is a chronic problem. The current episode started more than 1 year ago. Associated symptoms include blurred vision. Pertinent negatives include no chest pain, headaches, palpitations or shortness of breath. Risk factors for coronary artery disease include dyslipidemia, diabetes mellitus, smoking/tobacco exposure, sedentary lifestyle and post-menopausal state. Past treatments include angiotensin blockers.  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    Review of Systems  Constitutional:  Positive for fatigue. Negative for chills, fever and unexpected weight change.  HENT:  Negative for trouble swallowing and voice change.   Eyes:  Positive for blurred vision. Negative for visual disturbance.  Respiratory:  Negative for cough, shortness of breath and wheezing.   Cardiovascular:  Negative for chest pain, palpitations and leg swelling.  Gastrointestinal:  Negative for diarrhea, nausea and vomiting.  Endocrine: Negative for cold intolerance, heat intolerance, polydipsia, polyphagia and polyuria.  Musculoskeletal:  Positive for arthralgias, back pain and myalgias.  Skin:  Negative for color change, pallor, rash and wound.  Neurological:  Negative for seizures and headaches.  Psychiatric/Behavioral:  Negative for confusion and suicidal ideas.    Objective:    Vitals with BMI 05/29/2021 01/24/2021 11/23/2020  Height '5\' 4"'$  '5\' 4"'$  '5\' 4"'$   Weight 163 lbs 163 lbs 162 lbs  BMI 27.97 [id number]  Systolic [id number]  Diastolic 72 58 61  Pulse 57 56 61    BP 129/72   Pulse (!) 57   Ht '5\' 4"'$  (1.626 m)   Wt 163 lb (73.9 kg)   BMI 27.98 kg/m   Wt Readings from Last 3 Encounters:  05/29/21 163 lb (73.9 kg)  01/24/21 163 lb (73.9 kg)  11/23/20 162 lb (73.5 kg)      CMP ( most recent) CMP     Component Value Date/Time   NA 141 01/18/2021 0000   K 4.0 01/18/2021 0000   CL 108 01/18/2021 0000   CO2 29 (A) 01/18/2021 0000   GLUCOSE 136 (H) 08/18/2015 1449   BUN 27 (A) 01/18/2021 0000   CREATININE 1.0 01/18/2021 0000   CREATININE 0.83 08/18/2015 1449   CALCIUM 9.5 01/18/2021 0000   PROT 6.7 08/18/2015 1449   ALBUMIN 3.9 01/18/2021 0000   AST 29 01/18/2021 0000   ALT 30 01/18/2021 0000   ALKPHOS 61 01/18/2021 0000   BILITOT 0.4 08/18/2015 1449   GFRNONAA 55 01/18/2021 0000   GFRAA >60 01/18/2021 0000     Diabetic Labs (most recent): Lab Results  Component Value Date    HGBA1C 8.6 (A) 05/29/2021   HGBA1C 6.5 01/24/2021   HGBA1C 10.7 10/13/2020     Assessment & Plan:   1. DM type 2 causing vascular disease ([place])  - Theresa Hernandez has currently uncontrolled symptomatic type 2 DM since  80 years of age,  with most recent A1c of 10.7 %. Recent labs reviewed.  Her previous measurements were 10.9% and 11.2%.  This patient was accompanied by her friend Theresa Hernandez to clinic.  Her friend Theresa Hernandez is offering to help her.  She brought her meter and logs showing average blood glucose of 180 for the last month, point-of-care A1c of 8.6% increasing from 6.5% during her last visit.  She did not have any major hypoglycemia documented.   She failed to call clinic for reporting hyperglycemia.     - I had a long discussion with her about the progressive nature of diabetes and the pathology behind its complications. -her diabetes is complicated by coronary artery disease, CVA, several comorbidities.  And she remains at exceedingly high risk for more acute and chronic complications which include CAD, CVA, CKD, retinopathy, and neuropathy. These are all discussed in detail with her.  - I have counseled her on diet  and weight management  by adopting a carbohydrate restricted/protein rich diet. Patient is encouraged to switch to  unprocessed or minimally processed     complex starch and increased protein intake (animal or plant source), fruits, and vegetables. -  she is advised to stick to a routine mealtimes to eat 3 meals  a day and avoid unnecessary snacks ( to snack only to correct hypoglycemia).   - she acknowledges that there is a room for improvement in her food and drink choices. - Suggestion is made for her to avoid simple carbohydrates  from her diet including Cakes, Sweet Desserts, Ice Cream, Soda (diet and regular), Sweet Tea, Candies, Chips, Cookies, Store Bought Juices, Alcohol in Excess of  1-2 drinks a day, Artificial Sweeteners,  Coffee Creamer, and "Sugar-free" Products,  Lemonade. This will help patient to have more stable blood glucose profile and potentially avoid unintended weight gain.   - she will be scheduled with Jearld Fenton, RDN, CDE for diabetes education.  -  I have approached her with the following individualized plan to manage  her diabetes and patient agrees:   -In light of her presentation with loss of control of glycemia, I discussed and increased her basal insulin Basaglar to 36 units nightly, advised to continue monitoring blood glucose twice a day-daily before breakfast and at bedtime.    - she is warned not to take insulin without proper monitoring per orders.  - she is encouraged to call clinic for blood glucose levels less than 70 or above 200 mg /dl. - she has normal renal function, however patient reports intolerance to Metformin.    -She has tolerated and benefiting from low-dose glipizide.  She is advised to continue glipizide 5 mg XL p.o. daily at breakfast.   - Specific targets for  A1c;  LDL, HDL,  and Triglycerides were discussed with the patient.  2) Blood Pressure /Hypertension:  Her blood pressure is controlled to target.   she is advised to continue her current medications including losartan 100 mg p.o. daily with breakfast . 3) Lipids/Hyperlipidemia: No recent lipid panel to review.  She is advised to continue atorvastatin 80 mg p.o. nightly.  She is also on ezetimibe 10 mg p.o. daily.    Side effects and precautions discussed with her.    4)  Weight/Diet:  Body mass index is 27.98 kg/m.  -    she reports recent unintentional weight loss of 40 pounds, not a candidate for major weight loss. I discussed with her the fact that loss of 5 - 10% of her  current body weight will have the most impact on her diabetes management.  Exercise, and detailed carbohydrates information provided  -  detailed on discharge instructions.  5) Chronic Care/Health Maintenance:  -she  is on ACEI/ARB and Statin medications and  is encouraged  to initiate and continue to follow up with Ophthalmology, Dentist,  Podiatrist at least yearly or according to recommendations, and advised to   stay away from smoking. I have recommended yearly flu vaccine and pneumonia vaccine at least every 5 years; moderate intensity exercise for up to 150 minutes weekly; and  sleep for at least 7 hours a day.  Point-of-care ABI for PAD was normal in February 2022.  Her test will be repeated in February 2027 or sooner if needed.  - she is  advised to maintain close follow up with Pomposini, Cherly Anderson, MD for primary care needs, as well as her other providers for optimal and coordinated care.  I spent 30 minutes in the care of the patient today including review of labs from [facility], Lipids, Thyroid Function, Hematology (current and previous including abstractions from other facilities); face-to-face time discussing  her blood glucose readings/logs, discussing hypoglycemia and hyperglycemia episodes and symptoms, medications doses, her options of short and long term treatment based on the latest standards of care / guidelines;  discussion about incorporating lifestyle medicine;  and documenting the encounter.    Please refer to Patient Instructions for Blood Glucose Monitoring and Insulin/Medications Dosing Guide"  in media tab for additional information. Please  also refer to " Patient Self Inventory" in the Media  tab for reviewed elements of pertinent patient history.  Dickey Gave participated in the discussions, expressed understanding, and voiced agreement with the above plans.  All questions were answered to her satisfaction. she is encouraged to contact clinic should she have any questions or concerns prior to her return visit.    Follow up plan: - Return in about 3  months (around 08/29/2021) for F/U with Pre-visit Labs, Meter, Logs, A1c here.Glade Lloyd, MD [facility] [address] [place],  [place] [address] Phone: [phone]  Fax: [phone]    05/29/2021, 2:36 PM  This note was partially dictated with voice recognition software. Similar sounding words can be transcribed inadequately or may not  be corrected upon review.

## 2021-07-02 ENCOUNTER — Other Ambulatory Visit: Payer: Self-pay | Admitting: "Endocrinology

## 2021-08-29 LAB — LIPID PANEL
Cholesterol: 96 (ref 0–200)
HDL: 32 — AB (ref 35–70)
LDL Cholesterol: 48
Triglycerides: 79 (ref 40–160)

## 2021-08-29 LAB — BASIC METABOLIC PANEL
BUN: 20 (ref 4–21)
CO2: 29 — AB (ref 13–22)
Chloride: 109 — AB (ref 99–108)
Creatinine: 1 (ref 0.5–1.1)
Glucose: 147
Potassium: 4 (ref 3.4–5.3)
Sodium: 141 (ref 137–147)

## 2021-08-29 LAB — HEPATIC FUNCTION PANEL
ALT: 22 (ref 7–35)
AST: 15 (ref 13–35)
Alkaline Phosphatase: 71 (ref 25–125)
Bilirubin, Total: 0.4

## 2021-08-29 LAB — COMPREHENSIVE METABOLIC PANEL
Albumin: 4 (ref 3.5–5.0)
Calcium: 9.3 (ref 8.7–10.7)
GFR calc Af Amer: >60
GFR calc non Af Amer: >60

## 2021-08-29 LAB — TSH: TSH: 2.27 (ref 0.41–5.90)

## 2021-09-04 ENCOUNTER — Encounter (INDEPENDENT_AMBULATORY_CARE_PROVIDER_SITE_OTHER): Payer: Self-pay | Admitting: *Deleted

## 2021-09-05 ENCOUNTER — Other Ambulatory Visit: Payer: Self-pay

## 2021-09-05 ENCOUNTER — Encounter: Payer: Self-pay | Admitting: "Endocrinology

## 2021-09-05 ENCOUNTER — Ambulatory Visit: Payer: Medicare HMO | Admitting: "Endocrinology

## 2021-09-05 VITALS — BP 144/78 | HR 60 | Ht 64.0 in | Wt 152.8 lb

## 2021-09-05 DIAGNOSIS — E1159 Type 2 diabetes mellitus with other circulatory complications: Secondary | ICD-10-CM

## 2021-09-05 DIAGNOSIS — E782 Mixed hyperlipidemia: Secondary | ICD-10-CM | POA: Diagnosis not present

## 2021-09-05 DIAGNOSIS — I10 Essential (primary) hypertension: Secondary | ICD-10-CM

## 2021-09-05 LAB — POCT GLYCOSYLATED HEMOGLOBIN (HGB A1C): HbA1c, POC (controlled diabetic range): 8 % — AB (ref 0.0–7.0)

## 2021-09-05 NOTE — Progress Notes (Signed)
09/05/2021, 6:57 PM  Endocrinology follow-up note   Subjective:    Patient ID: Theresa Hernandez, female    DOB: 09-08-1941.  Theresa Hernandez is being seen in follow up after she was seen in  consultation for management of currently her original consult was requested  by  Pomposini, Cherly Anderson, MD.   Past Medical History:  Diagnosis Date   Change in stool 05/09/2017   Diabetes ([place])    Fibromyalgia    GERD (gastroesophageal reflux disease)    High cholesterol    Hypertension     Past Surgical History:  Procedure Laterality Date   bust reduction     CARPAL TUNNEL RELEASE     both   ESOPHAGEAL DILATION N/A 09/05/2015   Procedure: ESOPHAGEAL DILATION;  Surgeon: Rogene Houston, MD;  [facility];  Service: Endoscopy;  Laterality: N/A;   ESOPHAGOGASTRODUODENOSCOPY N/A 09/05/2015   Procedure: ESOPHAGOGASTRODUODENOSCOPY (EGD);  Surgeon: Rogene Houston, MD;  [facility];  Service: Endoscopy;  Laterality: N/A;  8:55   KNEE ARTHROSCOPY     LUMBAR EPIDURAL INJECTION     NASAL FRACTURE SURGERY     NECK SURGERY     TUBAL LIGATION      Social History   Socioeconomic History   Marital status: Divorced    Spouse name: Not on file   Number of children: Not on file   Years of education: Not on file   Highest education level: Not on file  Occupational History   Not on file  Tobacco Use   Smoking status: Never   Smokeless tobacco: Never  Vaping Use   Vaping Use: Never used  Substance and Sexual Activity   Alcohol use: No    Alcohol/week: 0.0 standard drinks   Drug use: No   Sexual activity: Not on file  Other Topics Concern   Not on file  Social History Narrative   Not on file   Social Determinants of Health   Financial Resource Strain: Not on file  Food Insecurity: Not on file  Transportation Needs: Not on file  Physical Activity: Not on file  Stress: Not on file  Social  Connections: Not on file    History reviewed. No pertinent family history.  Outpatient Encounter Medications as of 09/05/2021  Medication Sig   amLODipine (NORVASC) 5 MG tablet Take 1 tablet by mouth daily.   Apple Cider Vinegar 600 MG CAPS Take 1 capsule by mouth daily.   aspirin 81 MG tablet Take 81 mg by mouth daily.   atorvastatin (LIPITOR) 80 MG tablet Take 80 mg by mouth daily.   BIOTIN PO Take by mouth.   carbidopa-levodopa (SINEMET IR) 10-100 MG tablet Take 1 tablet by mouth 4 (four) times daily.   cholecalciferol (VITAMIN D) 1000 UNITS tablet Take 2,000 Units by mouth daily.   clopidogrel (PLAVIX) 75 MG tablet Take 1 tablet by mouth daily.   cyanocobalamin 1000 MCG tablet Take 1 tablet by mouth daily.   ezetimibe (ZETIA) 10 MG tablet Take 1 tablet by mouth daily.   fenofibrate 160 MG tablet Take 160 mg by mouth  daily.   glipiZIDE (GLUCOTROL XL) 5 MG 24 hr tablet TAKE 1 TABLET(5 MG) BY MOUTH DAILY WITH BREAKFAST   Insulin Glargine (BASAGLAR KWIKPEN) 100 UNIT/ML Inject 38 Units into the skin at bedtime.   losartan (COZAAR) 100 MG tablet Take 100 mg by mouth daily.   omeprazole (PRILOSEC) 20 MG capsule Take 1 capsule by mouth daily.   sotalol (BETAPACE) 80 MG tablet Take 1 tablet by mouth 2 (two) times daily.   No facility-administered encounter medications on file as of 09/05/2021.    ALLERGIES: Allergies  Allergen Reactions   Ciprofloxacin Nausea And Vomiting    VACCINATION STATUS:  There is no immunization history on file for this patient.  Diabetes She presents for her follow-up diabetic visit. She has type 2 diabetes mellitus. Onset time: She was diagnosed at approximate age of 48 years. Her disease course has been worsening. There are no hypoglycemic associated symptoms. Pertinent negatives for hypoglycemia include no confusion, headaches, pallor or seizures. Associated symptoms include blurred vision, fatigue and foot paresthesias. Pertinent negatives for diabetes  include no chest pain, no polydipsia, no polyphagia and no polyuria. There are no hypoglycemic complications. Symptoms are worsening. 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 Risk factors for coronary artery disease include dyslipidemia, diabetes mellitus, post-menopausal, sedentary lifestyle and tobacco exposure. Current diabetic treatment includes insulin injections. Her weight is fluctuating minimally. She is following a generally unhealthy diet. When asked about meal planning, she reported none. She has not had a previous visit with a dietitian. She rarely participates in exercise. Her home blood glucose trend is increasing steadily. Her breakfast blood glucose range is generally 140-180 mg/dl. Her bedtime blood glucose range is generally >200 mg/dl. Her overall blood glucose range is >200 mg/dl. (She presents with her meter and logs showing controlled glycemic profile.  Her point-of-care A1c is 8%, improving from 8.6%.  She did not have any major hypoglycemia documented.   ) An ACE inhibitor/angiotensin II receptor blocker is being taken. Eye exam is current.  Hyperlipidemia This is a chronic problem. The current episode started more than 1 year ago. Exacerbating diseases include diabetes. Associated symptoms include myalgias. Pertinent negatives include no chest pain or shortness of breath. Current antihyperlipidemic treatment includes statins. Risk factors for coronary artery disease include dyslipidemia, diabetes mellitus, hypertension, a sedentary lifestyle and post-menopausal.  Hypertension This is a chronic problem. The current episode started more than 1 year ago. Associated symptoms include blurred vision. Pertinent negatives include no chest pain, headaches, palpitations or shortness of breath. Risk factors for coronary artery disease include dyslipidemia, diabetes mellitus, smoking/tobacco exposure, sedentary lifestyle and post-menopausal state. Past treatments include angiotensin  blockers. 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    Review of Systems  Constitutional:  Positive for fatigue. Negative for chills, fever and unexpected weight change.  HENT:  Negative for trouble swallowing and voice change.   Eyes:  Positive for blurred vision. Negative for visual disturbance.  Respiratory:  Negative for cough, shortness of breath and wheezing.   Cardiovascular:  Negative for chest pain, palpitations and leg swelling.  Gastrointestinal:  Negative for diarrhea, nausea and vomiting.  Endocrine: Negative for cold intolerance, heat intolerance, polydipsia, polyphagia and polyuria.  Musculoskeletal:  Positive for arthralgias, back pain and myalgias.  Skin:  Negative for color change, pallor, rash and wound.  Neurological:  Negative for seizures and headaches.  Psychiatric/Behavioral:  Negative for confusion and suicidal ideas.    Objective:    Vitals with BMI 09/05/2021 05/29/2021 01/24/2021  Height 5\' 4"  5\' 4"  5\' 4"   Weight 152 lbs 13 oz 163 lbs 163 lbs  BMI 26.22 38.10 17.51  Systolic 025 852 778  Diastolic 78 72 58  Pulse 60 57 56    BP (!) 144/78   Pulse 60   Ht 5\' 4"  (1.626 m)   Wt 152 lb 12.8 oz (69.3 kg)   BMI 26.23 kg/m   Wt Readings from Last 3 Encounters:  09/05/21 152 lb 12.8 oz (69.3 kg)  05/29/21 163 lb (73.9 kg)  01/24/21 163 lb (73.9 kg)      CMP ( most recent) CMP     Component Value Date/Time   NA 141 08/29/2021 0000   K 4.0 08/29/2021 0000   CL 109 (A) 08/29/2021 0000   CO2 29 (A) 08/29/2021 0000   GLUCOSE 136 (H) 08/18/2015 1449   BUN 20 08/29/2021 0000   CREATININE 1.0 08/29/2021 0000   CREATININE 0.83 08/18/2015 1449   CALCIUM 9.3 08/29/2021 0000   PROT 6.7 08/18/2015 1449   ALBUMIN 4.0 08/29/2021 0000   AST 15 08/29/2021 0000   ALT 22 08/29/2021 0000   ALKPHOS 71 08/29/2021 0000   BILITOT 0.4 08/18/2015 1449   GFRNONAA >60 08/29/2021 0000   GFRAA >60 08/29/2021 0000     Diabetic Labs (most recent): Lab  Results  Component Value Date   HGBA1C 8.0 (A) 09/05/2021   HGBA1C 8.6 (A) 05/29/2021   HGBA1C 6.5 01/24/2021     Assessment & Plan:   1. DM type 2 causing vascular disease ([place])  - Dhanya Bogle has currently uncontrolled symptomatic type 2 DM since  80 years of age.  She presents with her meter and logs showing controlled glycemic profile.  Her point-of-care A1c is 8%, improving from 8.6%.  She did not have any major hypoglycemia documented.    Did have A1c as high as 11.2% 3 years ago.  - I had a long discussion with her about the progressive nature of diabetes and the pathology behind its complications. -her diabetes is complicated by coronary artery disease, CVA, several comorbidities.  And she remains at exceedingly high risk for more acute and chronic complications which include CAD, CVA, CKD, retinopathy, and neuropathy. These are all discussed in detail with her.  - I have counseled her on diet  and weight management  by adopting a carbohydrate restricted/protein rich diet. Patient is encouraged to switch to  unprocessed or minimally processed     complex starch and increased protein intake (animal or plant source), fruits, and vegetables. -  she is advised to stick to a routine mealtimes to eat 3 meals  a day and avoid unnecessary snacks ( to snack only to correct hypoglycemia).     - she has been  scheduled with Jearld Fenton, RDN, CDE for diabetes education.  - I have approached her with the following individualized plan to manage  her diabetes and patient agrees:   -In light of her presentation with near target glycemic profile, she will not need prandial insulin for now.  She is advised to increase her Basaglar to 38 units nightly,  advised to continue monitoring blood glucose twice a day-daily before breakfast and at bedtime.    - she is warned not to take insulin without proper monitoring per orders.  - she is encouraged to call clinic for blood glucose levels less  than 70 or above 200 mg /dl. - she has normal renal function, however patient reports intolerance to Metformin.    -  She has tolerated and benefiting from low-dose glipizide.  She is advised to continue glipizide 5 mg XL p.o. daily at breakfast.  Plant-based, whole foods lifestyle nutrition was discussed and recommended for her.  - Specific targets for  A1c;  LDL, HDL,  and Triglycerides were discussed with the patient.  2) Blood Pressure /Hypertension:  -Her blood pressure is well controlled.   she is advised to continue her current medications including losartan 100 mg p.o. daily with breakfast . 3) Lipids/Hyperlipidemia: No recent lipid panel to review.  She is advised to continue atorvastatin 80 mg p.o. nightly.  She is also on ezetimibe 10 mg p.o. daily.    Side effects and precautions discussed with her.    4)  Weight/Diet:  Body mass index is 26.23 kg/m.  -    she reports recent unintentional weight loss of 40 pounds, not a candidate for major weight loss. I discussed with her the fact that loss of 5 - 10% of her  current body weight will have the most impact on her diabetes management.  Exercise, and detailed carbohydrates information provided  -  detailed on discharge instructions.  5) Chronic Care/Health Maintenance:  -she  is on ACEI/ARB and Statin medications and  is encouraged to initiate and continue to follow up with Ophthalmology, Dentist,  Podiatrist at least yearly or according to recommendations, and advised to   stay away from smoking. I have recommended yearly flu vaccine and pneumonia vaccine at least every 5 years; moderate intensity exercise for up to 150 minutes weekly; and  sleep for at least 7 hours a day.  Point-of-care ABI for PAD was normal in February 2022.  Her test will be repeated in February 2027 or sooner if needed.  - she is  advised to maintain close follow up with Pomposini, Cherly Anderson, MD for primary care needs, as well as her other providers for optimal  and coordinated care.    I spent 41 minutes in the care of the patient today including review of labs from [facility], Lipids, Thyroid Function, Hematology (current and previous including abstractions from other facilities); face-to-face time discussing  her blood glucose readings/logs, discussing hypoglycemia and hyperglycemia episodes and symptoms, medications doses, her options of short and long term treatment based on the latest standards of care / guidelines;  discussion about incorporating lifestyle medicine;  and documenting the encounter.    Please refer to Patient Instructions for Blood Glucose Monitoring and Insulin/Medications Dosing Guide"  in media tab for additional information. Please  also refer to " Patient Self Inventory" in the Media  tab for reviewed elements of pertinent patient history.  Dickey Gave participated in the discussions, expressed understanding, and voiced agreement with the above plans.  All questions were answered to her satisfaction. she is encouraged to contact clinic should she have any questions or concerns prior to her return visit.    Follow up plan: - Return in about 6 months (around 03/06/2022) for F/U with Pre-visit Labs, Meter, Logs, A1c here.Glade Lloyd, MD [facility] [address] [place], [place] [address] Phone: [phone]  Fax: [phone]    09/05/2021, 6:57 PM  This note was partially dictated with voice recognition software. Similar sounding words can be transcribed inadequately or may not  be corrected upon review.

## 2021-09-05 NOTE — Patient Instructions (Signed)

## 2021-10-18 ENCOUNTER — Telehealth: Payer: Self-pay | Admitting: "Endocrinology

## 2021-10-18 DIAGNOSIS — E1159 Type 2 diabetes mellitus with other circulatory complications: Secondary | ICD-10-CM

## 2021-10-18 MED ORDER — GLUCOSE BLOOD VI STRP: ORAL_STRIP | 2 refills | Status: DC

## 2021-10-18 NOTE — Telephone Encounter (Signed)
Tried to call pt for clarification, was unable to reach anyone. Rx for test strips sent to [facility] to test twice daily as instructed at last visit.

## 2021-10-18 NOTE — Telephone Encounter (Signed)
Pt is calling and states she is due for test strips and her previous doctor is who was prescribing them but now we take care of her diabetes. She is needing the One Touch Verio strips and she said her insurance told her they will only cover this if it states she is testing 3x a day. Patient states she usually only checks her sugar AM and PM unless she is feeling bad then she will check it a 3rd time. Please advise   [facility] [place], [place] AT [address] Phone:  [phone]  Fax:  [phone]

## 2021-12-25 ENCOUNTER — Other Ambulatory Visit: Payer: Self-pay

## 2021-12-25 ENCOUNTER — Encounter (INDEPENDENT_AMBULATORY_CARE_PROVIDER_SITE_OTHER): Payer: Self-pay | Admitting: Gastroenterology

## 2021-12-25 ENCOUNTER — Ambulatory Visit (INDEPENDENT_AMBULATORY_CARE_PROVIDER_SITE_OTHER): Payer: Medicare HMO | Admitting: Gastroenterology

## 2021-12-25 ENCOUNTER — Encounter (INDEPENDENT_AMBULATORY_CARE_PROVIDER_SITE_OTHER): Payer: Self-pay

## 2021-12-25 ENCOUNTER — Other Ambulatory Visit: Payer: Self-pay | Admitting: "Endocrinology

## 2021-12-25 DIAGNOSIS — K5904 Chronic idiopathic constipation: Secondary | ICD-10-CM

## 2021-12-25 DIAGNOSIS — R109 Unspecified abdominal pain: Secondary | ICD-10-CM | POA: Insufficient documentation

## 2021-12-25 DIAGNOSIS — K5900 Constipation, unspecified: Secondary | ICD-10-CM | POA: Insufficient documentation

## 2021-12-25 DIAGNOSIS — R1031 Right lower quadrant pain: Secondary | ICD-10-CM | POA: Diagnosis not present

## 2021-12-25 NOTE — Progress Notes (Signed)
[name], M.D. ?Gastroenterology & Hepatology ?[facility] ?[address] ?[place], [place] [address] ?Primary Care Physician: ?Pomposini, Cherly Anderson, MD ?No address on file ? ?Referring MD: PCP ? ?Chief Complaint:  abdominal pain ? ?History of Present Illness: ?Theresa Hernandez is a 81 y.o. female history of diabetes, Parkinson's disease, fibromyalgia, GERD, hypertension, hyperlipidemia, ?cervix cancer, DDD and history of stroke with carotid artery disease s/p stent, who presents for evaluation of abdominal pain. ? ?The patient comes to the office with her friend who was a Designer, jewellery. ? ?Patient reports she has presented chronic constipation - has a bowel movement every 2-3 days. She takes Ex-Lax as needed to have a bowel movement, but she takes a stool softener on a daily basis. States the constipation has been present for at least year. She reports that she had cramping in the past before having a bowel movement which improved with defecation. She also presents significant abdominal distention when she has not been able to defecate. ? ?States she has presented abdominal pain in the right side of her abdomen intermittently for the last year. She reports that she " feels a hard area in the abdomen like a hard grabbing" that moves to the left side - she reports that the hard area will migrate and the right side will soften. The pain is happening almost every day. States she feels very bloated when her abdomen feels hard. She reports that she used to take ibuprofen to improve the pain but more recently has taken 2 extra strength Tylenol, which helped with the pain in the past but now takes longer. She reports that her back hurts significantly when she has the abdominal symptoms. ? ?The patient denies having any nausea, vomiting, fever, chills, hematochezia, Theresa, hematemesis, diarrhea, jaundice, pruritus or weight loss. ? ?No recent abdominal imaging is  available. ? ?Dr. Lawana Pai prescribed Plavix for stroke prevention. ? ?Last [id number]  ?No evidence of erosive esophagitis ring or stricture formation. ?Few small hyperplastic appearing polyps at gastric body and fundus. These were left alone. ?Erosive antral gastritis. ?Esophagus dilated by passing 71 French Maloney dilator to full insertion but no disruption noted to esophageal mucosa. ? ?Last Colonoscopy:2 years ago per patient, does not remember where it was done or what where the findings ? ?FHx: neg for any gastrointestinal/liver disease, no malignancies ?Social: former smoker but quit in 1992, quit alcohol in 2563, neg illicit drug use ?Surgical: tubal ligation ? ?Past Medical History: ?Past Medical History:  ?Diagnosis Date  ? Change in stool 05/09/2017  ? Diabetes ([place])   ? Fibromyalgia   ? GERD (gastroesophageal reflux disease)   ? High cholesterol   ? Hypertension   ? ? ?Past Surgical History: ?Past Surgical History:  ?Procedure Laterality Date  ? bust reduction    ? CARPAL TUNNEL RELEASE    ? both  ? ESOPHAGEAL DILATION N/A 09/05/2015  ? Procedure: ESOPHAGEAL DILATION;  Surgeon: Rogene Houston, MD;  [facility];  Service: Endoscopy;  Laterality: N/A;  ? ESOPHAGOGASTRODUODENOSCOPY N/A 09/05/2015  ? Procedure: ESOPHAGOGASTRODUODENOSCOPY (EGD);  Surgeon: Rogene Houston, MD;  [facility];  Service: Endoscopy;  Laterality: N/A;  8:55  ? KNEE ARTHROSCOPY    ? LUMBAR EPIDURAL INJECTION    ? NASAL FRACTURE SURGERY    ? NECK SURGERY    ? TUBAL LIGATION    ? ? ?Family History:History reviewed. No pertinent family history. ? ?Social History: ?Social History  ? ?Tobacco  Use  ?Smoking Status Never  ?Smokeless Tobacco Never  ? ?Social History  ? ?Substance and Sexual Activity  ?Alcohol Use No  ? Alcohol/week: 0.0 standard drinks  ? ?Social History  ? ?Substance and Sexual Activity  ?Drug Use No  ? ? ?Allergies: ?Allergies  ?Allergen Reactions  ? Ciprofloxacin Nausea And Vomiting   ? ? ?Medications: ?Current Outpatient Medications  ?Medication Sig Dispense Refill  ? ALPRAZolam (XANAX) 0.25 MG tablet Take 0.25 mg by mouth at bedtime as needed for anxiety.    ? amLODipine (NORVASC) 5 MG tablet Take 1 tablet by mouth daily.    ? Apple Cider Vinegar 600 MG CAPS Take 2 capsules by mouth daily.    ? aspirin 81 MG tablet Take 81 mg by mouth daily.    ? atorvastatin (LIPITOR) 80 MG tablet Take 80 mg by mouth daily.    ? BIOTIN PO Take by mouth daily at 6 (six) AM.    ? carbidopa-levodopa (SINEMET IR) 10-100 MG tablet Take 1 tablet by mouth 4 (four) times daily.    ? clopidogrel (PLAVIX) 75 MG tablet Take 1 tablet by mouth daily.    ? docusate sodium (COLACE) 100 MG capsule Take 100 mg by mouth daily.    ? ezetimibe (ZETIA) 10 MG tablet Take 1 tablet by mouth daily.    ? glipiZIDE (GLUCOTROL XL) 5 MG 24 hr tablet TAKE 1 TABLET(5 MG) BY MOUTH DAILY WITH BREAKFAST 90 tablet 1  ? glucose blood test strip Use to test blood glucose twice daily as instructed 200 each 2  ? Insulin Glargine (BASAGLAR KWIKPEN) 100 UNIT/ML Inject 38 Units into the skin at bedtime.    ? losartan (COZAAR) 100 MG tablet Take 100 mg by mouth daily.    ? Multiple Vitamins-Minerals (MULTIVITAMIN WITH MINERALS) tablet Take 1 tablet by mouth daily. With collagen 2 gummies per day    ? OVER THE COUNTER MEDICATION Vit B Complex one bid.    ? QUEtiapine (SEROQUEL) 100 MG tablet Take 100 mg by mouth at bedtime.    ? sotalol (BETAPACE) 80 MG tablet Take 1 tablet by mouth 2 (two) times daily.    ? cholecalciferol (VITAMIN D) 1000 UNITS tablet Take 2,000 Units by mouth daily. (Patient not taking: Reported on 12/25/2021)    ? ?No current facility-administered medications for this visit.  ? ? ?Review of Systems: ?GENERAL: negative for malaise, night sweats ?HEENT: No changes in hearing or vision, no nose bleeds or other nasal problems. ?NECK: Negative for lumps, goiter, pain and significant neck swelling ?RESPIRATORY: Negative for cough,  wheezing ?CARDIOVASCULAR: Negative for chest pain, leg swelling, palpitations, orthopnea ?GI: SEE HPI ?MUSCULOSKELETAL: Negative for joint pain or swelling, back pain, and muscle pain. ?SKIN: Negative for lesions, rash ?PSYCH: Negative for sleep disturbance, mood disorder and recent psychosocial stressors. ?HEMATOLOGY Negative for prolonged bleeding, bruising easily, and swollen nodes. ?ENDOCRINE: Negative for cold or heat intolerance, polyuria, polydipsia and goiter. ?NEURO: negative for tremor, gait imbalance, syncope and seizures. ?The remainder of the review of systems is noncontributory. ? ? ?Physical Exam: ?BP 136/86 (BP Location: Left Arm, Patient Position: Sitting, Cuff Size: Large)   Pulse 69   Temp 99.1 ?F (37.3 ?C) (Oral)   Ht '5\' 4"'$  (1.626 m)   Wt 164 lb (74.4 kg)   BMI 28.15 kg/m?  ?GENERAL: The patient is AO x3, in no acute distress.  Overweight. ?HEENT: Head is normocephalic and atraumatic. EOMI are intact. Mouth is well hydrated and without lesions. ?NECK:  Supple. No masses ?LUNGS: Clear to auscultation. No presence of rhonchi/wheezing/rales. Adequate chest expansion ?HEART: RRR, normal s1 and s2. ?ABDOMEN: tender to palpation on the right side of the abdomen diffusely but no guarding, no peritoneal signs, and nondistended. BS +. No masses. ?EXTREMITIES: Without any cyanosis, clubbing, rash, lesions or edema. ?NEUROLOGIC: AOx3, no focal motor deficit. ?SKIN: no jaundice, no rashes ? ? ?Imaging/Labs: ?as above ? ?I personally reviewed and interpreted the available labs, imaging and endoscopic files. ? ?Impression and Plan: ?Theresa Hernandez is a 81 y.o. female history of diabetes, Parkinson's disease, fibromyalgia, GERD, hypertension, hyperlipidemia, ?cervix cancer, DDD and history of stroke with carotid artery disease s/p stent, who presents for evaluation of abdominal pain.  The patient has presented fluctuating episodes of abdominal pain of unclear etiology along with intermittent constipation.   She is not taking any laxative regimen on a daily basis to improve her symptoms for which I recommended her to start taking MiraLAX on a daily basis and uptitrate as needed.  We will evaluate her symptoms further with a CBC, CMP, ce

## 2021-12-25 NOTE — Patient Instructions (Addendum)
Perform blood workup ?Schedule CT abdomen/pelvis with IV contrast ?Start taking Miralax 1 capful every day for one week. If bowel movements do not improve, increase to 1 capful every 12 hours. If after two weeks there is no improvement, increase to 1 capful every 8 hours ?May consider repeating colonoscopy if imaging is unremarkable ?

## 2021-12-26 LAB — CBC WITH DIFFERENTIAL/PLATELET
Absolute Monocytes: 735 cells/uL (ref 200–950)
Basophils Absolute: 21 cells/uL (ref 0–200)
Basophils Relative: 0.3 %
Eosinophils Absolute: 98 cells/uL (ref 15–500)
Eosinophils Relative: 1.4 %
HCT: 39.3 % (ref 35.0–45.0)
Hemoglobin: 13.1 g/dL (ref 11.7–15.5)
Lymphs Abs: 1911 cells/uL (ref 850–3900)
MCH: 30.4 pg (ref 27.0–33.0)
MCHC: 33.3 g/dL (ref 32.0–36.0)
MCV: 91.2 fL (ref 80.0–100.0)
MPV: 12.8 fL — ABNORMAL HIGH (ref 7.5–12.5)
Monocytes Relative: 10.5 %
Neutro Abs: 4235 cells/uL (ref 1500–7800)
Neutrophils Relative %: 60.5 %
Platelets: 197 10*3/uL (ref 140–400)
RBC: 4.31 10*6/uL (ref 3.80–5.10)
RDW: 11.9 % (ref 11.0–15.0)
Total Lymphocyte: 27.3 %
WBC: 7 10*3/uL (ref 3.8–10.8)

## 2021-12-26 LAB — COMPREHENSIVE METABOLIC PANEL
AG Ratio: 2 (calc) (ref 1.0–2.5)
ALT: 12 U/L (ref 6–29)
AST: 18 U/L (ref 10–35)
Albumin: 4 g/dL (ref 3.6–5.1)
Alkaline phosphatase (APISO): 104 U/L (ref 37–153)
BUN: 16 mg/dL (ref 7–25)
CO2: 28 mmol/L (ref 20–32)
Calcium: 10 mg/dL (ref 8.6–10.4)
Chloride: 105 mmol/L (ref 98–110)
Creat: 0.78 mg/dL (ref 0.60–1.00)
Globulin: 2 g/dL (calc) (ref 1.9–3.7)
Glucose, Bld: 245 mg/dL — ABNORMAL HIGH (ref 65–99)
Potassium: 4.4 mmol/L (ref 3.5–5.3)
Sodium: 140 mmol/L (ref 135–146)
Total Bilirubin: 0.6 mg/dL (ref 0.2–1.2)
Total Protein: 6 g/dL — ABNORMAL LOW (ref 6.1–8.1)

## 2021-12-26 LAB — CELIAC DISEASE PANEL
(tTG) Ab, IgA: <1 U/mL
(tTG) Ab, IgG: <1 U/mL
Gliadin IgA: 79.7 U/mL — ABNORMAL HIGH
Gliadin IgG: <1 U/mL
Immunoglobulin A: 56 mg/dL — ABNORMAL LOW (ref 70–320)

## 2021-12-26 LAB — TSH: TSH: 1.24 mIU/L (ref 0.40–4.50)

## 2022-02-03 ENCOUNTER — Other Ambulatory Visit: Payer: Self-pay | Admitting: "Endocrinology

## 2022-02-06 ENCOUNTER — Ambulatory Visit (HOSPITAL_COMMUNITY)
Admission: RE | Admit: 2022-02-06 | Discharge: 2022-02-06 | Disposition: A | Discharge status: Home / Self Care | Payer: Medicare HMO | Source: Ambulatory Visit | Attending: Gastroenterology | Admitting: Gastroenterology

## 2022-02-06 ENCOUNTER — Encounter (HOSPITAL_COMMUNITY): Payer: Self-pay

## 2022-02-06 DIAGNOSIS — R1031 Right lower quadrant pain: Secondary | ICD-10-CM | POA: Insufficient documentation

## 2022-02-06 LAB — POCT I-STAT CREATININE: Creatinine, Ser: 1 mg/dL (ref 0.44–1.00)

## 2022-02-06 MED ORDER — IOHEXOL 300 MG/ML  SOLN
100.0000 mL | Freq: Once | INTRAMUSCULAR | Status: AC | PRN
  Administered 2022-02-06: 100 mL via INTRAVENOUS

## 2022-02-07 ENCOUNTER — Telehealth (INDEPENDENT_AMBULATORY_CARE_PROVIDER_SITE_OTHER): Payer: Self-pay

## 2022-02-07 ENCOUNTER — Other Ambulatory Visit (INDEPENDENT_AMBULATORY_CARE_PROVIDER_SITE_OTHER): Payer: Self-pay

## 2022-02-07 ENCOUNTER — Encounter (INDEPENDENT_AMBULATORY_CARE_PROVIDER_SITE_OTHER): Payer: Self-pay

## 2022-02-07 DIAGNOSIS — K219 Gastro-esophageal reflux disease without esophagitis: Secondary | ICD-10-CM

## 2022-02-07 DIAGNOSIS — R1031 Right lower quadrant pain: Secondary | ICD-10-CM

## 2022-02-07 DIAGNOSIS — K5904 Chronic idiopathic constipation: Secondary | ICD-10-CM

## 2022-02-07 MED ORDER — PEG 3350-KCL-NA BICARB-NACL 420 G PO SOLR: 4000.0000 mL | ORAL | 0 refills | Status: DC

## 2022-02-07 NOTE — Telephone Encounter (Signed)
Theresa Hernandez, CMA  ?

## 2022-02-07 NOTE — Progress Notes (Deleted)
Laken Rog Ann Dawayne Ohair, CMA  ?

## 2022-02-08 ENCOUNTER — Encounter (INDEPENDENT_AMBULATORY_CARE_PROVIDER_SITE_OTHER): Payer: Self-pay

## 2022-02-12 ENCOUNTER — Telehealth (INDEPENDENT_AMBULATORY_CARE_PROVIDER_SITE_OTHER): Payer: Self-pay

## 2022-02-12 NOTE — Telephone Encounter (Signed)
Dr Sondra Come states that Theresa Hernandez dob 06-Sep-2041 may hold her Xarelto 48 hrs prior to her procedure and resume it 24 hours after the procedure  ?

## 2022-02-23 LAB — BASIC METABOLIC PANEL
BUN: 21 (ref 4–21)
CO2: 26 — AB (ref 13–22)
Chloride: 108 (ref 99–108)
Creatinine: 0.9 (ref 0.5–1.1)
Glucose: 252
Potassium: 3.7 mEq/L (ref 3.5–5.1)
Sodium: 140 (ref 137–147)

## 2022-02-23 LAB — COMPREHENSIVE METABOLIC PANEL
Albumin: 3.6 (ref 3.5–5.0)
Calcium: 8.9 (ref 8.7–10.7)

## 2022-02-23 LAB — HEPATIC FUNCTION PANEL
ALT: 15 U/L (ref 7–35)
AST: 18 (ref 13–35)
Alkaline Phosphatase: 130 — AB (ref 25–125)
Bilirubin, Total: 0.6

## 2022-03-06 ENCOUNTER — Encounter: Payer: Self-pay | Admitting: "Endocrinology

## 2022-03-06 ENCOUNTER — Ambulatory Visit: Payer: Medicare HMO | Admitting: "Endocrinology

## 2022-03-06 VITALS — BP 128/56 | HR 57 | Ht 64.0 in | Wt 162.6 lb

## 2022-03-06 DIAGNOSIS — E1159 Type 2 diabetes mellitus with other circulatory complications: Secondary | ICD-10-CM | POA: Diagnosis not present

## 2022-03-06 DIAGNOSIS — E782 Mixed hyperlipidemia: Secondary | ICD-10-CM | POA: Diagnosis not present

## 2022-03-06 DIAGNOSIS — I10 Essential (primary) hypertension: Secondary | ICD-10-CM | POA: Diagnosis not present

## 2022-03-06 LAB — POCT GLYCOSYLATED HEMOGLOBIN (HGB A1C): HbA1c, POC (controlled diabetic range): 9.8 % — AB (ref 0.0–7.0)

## 2022-03-06 MED ORDER — INSULIN LISPRO PROT & LISPRO (75-25 MIX) 100 UNIT/ML KWIKPEN: 24.0000 [IU] | PEN_INJECTOR | Freq: Two times a day (BID) | SUBCUTANEOUS | 2 refills | Status: DC

## 2022-03-06 MED ORDER — FREESTYLE LIBRE 2 SENSOR MISC: 1.0000 | 3 refills | Status: DC

## 2022-03-06 MED ORDER — FREESTYLE LIBRE 2 READER DEVI: 0 refills | Status: DC

## 2022-03-06 NOTE — Patient Instructions (Signed)

## 2022-03-06 NOTE — Progress Notes (Unsigned)
03/06/2022, 5:56 PM  Endocrinology follow-up note   Subjective:    Patient ID: Theresa Hernandez, female    DOB: Nov 30, 1940.  Theresa Hernandez is being seen in follow up after she was seen in  consultation for management of currently her original consult was requested  by  Pomposini, Cherly Anderson, MD.   Past Medical History:  Diagnosis Date   Change in stool 05/09/2017   Diabetes ([place])    Fibromyalgia    GERD (gastroesophageal reflux disease)    High cholesterol    Hypertension     Past Surgical History:  Procedure Laterality Date   bust reduction     CARPAL TUNNEL RELEASE     both   ESOPHAGEAL DILATION N/A 09/05/2015   Procedure: ESOPHAGEAL DILATION;  Surgeon: Rogene Houston, MD;  Location: [facility];  Service: Endoscopy;  Laterality: N/A;   ESOPHAGOGASTRODUODENOSCOPY N/A 09/05/2015   Procedure: ESOPHAGOGASTRODUODENOSCOPY (EGD);  Surgeon: Rogene Houston, MD;  Location: [facility];  Service: Endoscopy;  Laterality: N/A;  8:55   KNEE ARTHROSCOPY     LUMBAR EPIDURAL INJECTION     NASAL FRACTURE SURGERY     NECK SURGERY     TUBAL LIGATION      Social History   Socioeconomic History   Marital status: Divorced    Spouse name: Not on file   Number of children: Not on file   Years of education: Not on file   Highest education level: Not on file  Occupational History   Not on file  Tobacco Use   Smoking status: Never   Smokeless tobacco: Never  Vaping Use   Vaping Use: Never used  Substance and Sexual Activity   Alcohol use: No    Alcohol/week: 0.0 standard drinks   Drug use: No   Sexual activity: Not on file  Other Topics Concern   Not on file  Social History Narrative   Not on file   Social Determinants of Health   Financial Resource Strain: Not on file  Food Insecurity: Not on file  Transportation Needs: Not on file  Physical Activity: Not on file  Stress: Not on file  Social  Connections: Not on file    History reviewed. No pertinent family history.  Outpatient Encounter Medications as of 03/06/2022  Medication Sig   Continuous Blood Gluc Receiver (FREESTYLE LIBRE 2 READER) DEVI As directed   Continuous Blood Gluc Sensor (FREESTYLE LIBRE 2 SENSOR) MISC 1 Piece by Does not apply route every 14 (fourteen) days.   Insulin Lispro Prot & Lispro (HUMALOG MIX 75/25 KWIKPEN) (75-25) 100 UNIT/ML Kwikpen Inject 24 Units into the skin 2 (two) times daily before a meal.   ALPRAZolam (XANAX) 0.25 MG tablet Take 0.25 mg by mouth at bedtime as needed for anxiety.   Apple Cider Vinegar 600 MG CAPS Take 600 mg by mouth daily.   aspirin 81 MG tablet Take 81 mg by mouth daily.   atorvastatin (LIPITOR) 80 MG tablet Take 80 mg by mouth daily.   b complex vitamins capsule Take 1 capsule by mouth daily.   Biotin 10000 MCG TABS Take  10,000 mcg by mouth daily at 6 (six) AM.   carbidopa-levodopa (SINEMET IR) 10-100 MG tablet Take 1 tablet by mouth 4 (four) times daily.   Cholecalciferol (VITAMIN D) 50 MCG (2000 UT) CAPS Take 2,000 Units by mouth daily.   docusate sodium (COLACE) 100 MG capsule Take 100 mg by mouth daily.   ezetimibe (ZETIA) 10 MG tablet Take 10 mg by mouth daily.   glipiZIDE (GLUCOTROL XL) 5 MG 24 hr tablet TAKE 1 TABLET(5 MG) BY MOUTH DAILY WITH BREAKFAST   glucose blood test strip Use to test blood glucose twice daily as instructed   hydrochlorothiazide (HYDRODIURIL) 25 MG tablet Take 25 mg by mouth daily.   ipratropium (ATROVENT) 0.06 % nasal spray Place 2 sprays into both nostrils 4 (four) times daily as needed for rhinitis.   losartan (COZAAR) 100 MG tablet Take 100 mg by mouth daily.   Multiple Vitamins-Minerals (MULTIVITAMIN WITH MINERALS) tablet Take 1 tablet by mouth daily.   polyethylene glycol-electrolytes (TRILYTE) 420 g solution Take 4,000 mLs by mouth as directed.   QUEtiapine (SEROQUEL) 100 MG tablet Take 50 mg by mouth at bedtime as needed (sleep).    sotalol (BETAPACE) 80 MG tablet Take 80 mg by mouth 2 (two) times daily.   XARELTO 20 MG TABS tablet Take 20 mg by mouth daily.   [DISCONTINUED] Insulin Glargine (BASAGLAR KWIKPEN) 100 UNIT/ML Inject 38 Units into the skin at bedtime.   No facility-administered encounter medications on file as of 03/06/2022.    ALLERGIES: Allergies  Allergen Reactions   Ciprofloxacin Nausea And Vomiting    VACCINATION STATUS:  There is no immunization history on file for this patient.  Diabetes She presents for her follow-up diabetic visit. She has type 2 diabetes mellitus. Onset time: She was diagnosed at approximate age of 58 years. Her disease course has been worsening. There are no hypoglycemic associated symptoms. Pertinent negatives for hypoglycemia include no confusion, headaches, pallor or seizures. Associated symptoms include blurred vision, fatigue and foot paresthesias. Pertinent negatives for diabetes include no chest pain, no polydipsia, no polyphagia and no polyuria. There are no hypoglycemic complications. Symptoms are worsening. 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 Risk factors for coronary artery disease include dyslipidemia, diabetes mellitus, post-menopausal, sedentary lifestyle and tobacco exposure. Current diabetic treatment includes insulin injections. Her weight is increasing steadily. She is following a generally unhealthy diet. When asked about meal planning, she reported none. She has not had a previous visit with a dietitian. She rarely participates in exercise. Her home blood glucose trend is increasing steadily. Her breakfast blood glucose range is generally 140-180 mg/dl. Her bedtime blood glucose range is generally >200 mg/dl. Her overall blood glucose range is >200 mg/dl. (She presents with her meter and logs showing uncontrolled glycemic profile.  Her average blood glucose is 162 fasting, 200+ postprandially.  Her point-of-care A1c is 9.8%, increasing from 8.6%.     She did not have any major hypoglycemia documented.   ) An ACE inhibitor/angiotensin II receptor blocker is being taken. Eye exam is current.  Hyperlipidemia This is a chronic problem. The current episode started more than 1 year ago. Exacerbating diseases include diabetes. Associated symptoms include myalgias. Pertinent negatives include no chest pain or shortness of breath. Current antihyperlipidemic treatment includes statins. Risk factors for coronary artery disease include dyslipidemia, diabetes mellitus, hypertension, a sedentary lifestyle and post-menopausal.  Hypertension This is a chronic problem. The current episode started more than 1 year ago. Associated symptoms include blurred vision. Pertinent  negatives include no chest pain, headaches, palpitations or shortness of breath. Risk factors for coronary artery disease include dyslipidemia, diabetes mellitus, smoking/tobacco exposure, sedentary lifestyle and post-menopausal state. Past treatments include angiotensin blockers. 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    Review of Systems  Constitutional:  Positive for fatigue. Negative for chills, fever and unexpected weight change.  HENT:  Negative for trouble swallowing and voice change.   Eyes:  Positive for blurred vision. Negative for visual disturbance.  Respiratory:  Negative for cough, shortness of breath and wheezing.   Cardiovascular:  Negative for chest pain, palpitations and leg swelling.  Gastrointestinal:  Negative for diarrhea, nausea and vomiting.  Endocrine: Negative for cold intolerance, heat intolerance, polydipsia, polyphagia and polyuria.  Musculoskeletal:  Positive for arthralgias, back pain and myalgias.  Skin:  Negative for color change, pallor, rash and wound.  Neurological:  Negative for seizures and headaches.  Psychiatric/Behavioral:  Negative for confusion and suicidal ideas.    Objective:       03/06/2022    2:48 PM 12/25/2021    2:00 PM  09/05/2021    2:16 PM  Vitals with BMI  Height '5\' 4"'$  '5\' 4"'$  '5\' 4"'$   Weight 162 lbs 10 oz 164 lbs 152 lbs 13 oz  BMI 27.9 25.05 39.76  Systolic 734 193 790  Diastolic 56 86 78  Pulse 57 69 60    BP (!) 128/56   Pulse (!) 57   Ht '5\' 4"'$  (1.626 m)   Wt 162 lb 9.6 oz (73.8 kg)   BMI 27.91 kg/m   Wt Readings from Last 3 Encounters:  03/06/22 162 lb 9.6 oz (73.8 kg)  12/25/21 164 lb (74.4 kg)  09/05/21 152 lb 12.8 oz (69.3 kg)      CMP ( most recent) CMP     Component Value Date/Time   NA 140 02/23/2022 0000   K 3.7 02/23/2022 0000   CL 108 02/23/2022 0000   CO2 26 (A) 02/23/2022 0000   GLUCOSE 245 (H) 12/25/2021 1533   BUN 21 02/23/2022 0000   CREATININE 0.9 02/23/2022 0000   CREATININE 1.00 02/06/2022 0901   CREATININE 0.78 12/25/2021 1533   CALCIUM 8.9 02/23/2022 0000   PROT 6.0 (L) 12/25/2021 1533   ALBUMIN 3.6 02/23/2022 0000   AST 18 02/23/2022 0000   ALT 15 02/23/2022 0000   ALKPHOS 130 (A) 02/23/2022 0000   BILITOT 0.6 12/25/2021 1533   GFRNONAA >60 08/29/2021 0000   GFRAA >60 08/29/2021 0000     Diabetic Labs (most recent): Lab Results  Component Value Date   HGBA1C 9.8 (A) 03/06/2022   HGBA1C 8.0 (A) 09/05/2021   HGBA1C 8.6 (A) 05/29/2021     Assessment & Plan:   1. DM type 2 causing vascular disease (Theresa Hernandez)  - Theresa Hernandez has currently uncontrolled symptomatic type 2 DM since  81 years of age.  She presents with her meter and logs showing uncontrolled glycemic profile.  Her average blood glucose is 162 fasting, 200+ postprandially.  Her point-of-care A1c is 9.8%, increasing from 8.6%.    She did not have any major hypoglycemia documented.    Did have A1c as high as 11.2% 3 years ago.  - I had a long discussion with her about the progressive nature of diabetes and the pathology behind its complications. -her diabetes is complicated by coronary artery disease, CVA, several comorbidities.  And she remains at exceedingly high risk for more acute and  chronic complications which include CAD, CVA,  CKD, retinopathy, and neuropathy. These are all discussed in detail with her.  - I have counseled her on diet  and weight management  by adopting a whole food plant-based diet.   Patient is encouraged to switch to  unprocessed or minimally processed     complex starch and increased protein intake mostly plant source, fruits, and vegetables. -  she is advised to stick to a routine mealtimes to eat 3 meals  a day and avoid unnecessary snacks ( to snack only to correct hypoglycemia).     - she has been  scheduled with Theresa Hernandez, Theresa Hernandez, Theresa Hernandez for diabetes education.  - I have approached her with the following individualized plan to manage  her diabetes and patient agrees:   -In light of her presentation with loss of control of glycemia, she will need multiple daily injections of insulin for now.    -I discussed and discontinued her Basaglar.  I discussed and prescribed Humalog 75/25 24 units with breakfast and 24 units with supper when her Premeal blood glucose readings are above 90 mg per DL.    Until her next visit in 10 days, she is advised to monitor blood glucose 4 times a day-before meals and at bedtime.   This patient would benefit from a CGM.  I discussed and prescribed the freestyle libre device for her.  - she is warned not to take insulin without proper monitoring per orders.  - she is encouraged to call clinic for blood glucose levels less than 70 or above 200 mg /dl. - she has normal renal function, however patient reports intolerance to Metformin.    -She has tolerated and benefiting from low-dose glipizide.  She is advised to continue glipizide 5 mg XL p.o. daily at breakfast.  Plant-based, whole foods lifestyle nutrition was discussed and recommended for her.  - Specific targets for  A1c;  LDL, HDL,  and Triglycerides were discussed with the patient.  2) Blood Pressure /Hypertension:  -Her blood pressure is controlled to  target.   she is advised to continue her current medications including losartan 100 mg p.o. daily with breakfast .  3) Lipids/Hyperlipidemia: No recent lipid panel to review.  She is advised to continue atorvastatin 80 mg p.o. nightly.   She is also on ezetimibe 10 mg p.o. daily.    Side effects and precautions discussed with her.    4)  Weight/Diet:  Body mass index is 27.91 kg/m.  -She is regaining the weight loss she has achieved prior to last visit.  I discussed with her the fact that loss of 5 - 10% of her  current body weight will have the most impact on her diabetes management.  Exercise, and detailed carbohydrates information provided  -  detailed on discharge instructions.  5) Chronic Care/Health Maintenance:  -she  is on ACEI/ARB and Statin medications and  is encouraged to initiate and continue to follow up with Ophthalmology, Dentist,  Podiatrist at least yearly or according to recommendations, and advised to   stay away from smoking. I have recommended yearly flu vaccine and pneumonia vaccine at least every 5 years; moderate intensity exercise for up to 150 minutes weekly; and  sleep for at least 7 hours a day.  Point-of-care ABI for PAD was normal in February 2022.  Her test will be repeated in February 2027 or sooner if needed.  - she is  advised to maintain close follow up with Pomposini, Cherly Anderson, MD for primary care needs, as well  as her other providers for optimal and coordinated care.    I spent 41 minutes in the care of the patient today including review of labs from [place], Lipids, Thyroid Function, Hematology (current and previous including abstractions from other facilities); face-to-face time discussing  her blood glucose readings/logs, discussing hypoglycemia and hyperglycemia episodes and symptoms, medications doses, her options of short and long term treatment based on the latest standards of care / guidelines;  discussion about incorporating lifestyle medicine;  and  documenting the encounter.    Please refer to Patient Instructions for Blood Glucose Monitoring and Insulin/Medications Dosing Guide"  in media tab for additional information. Please  also refer to " Patient Self Inventory" in the Media  tab for reviewed elements of pertinent patient history.  Dickey Gave participated in the discussions, expressed understanding, and voiced agreement with the above plans.  All questions were answered to her satisfaction. she is encouraged to contact clinic should she have any questions or concerns prior to her return visit.    Follow up plan: - Return in about 1 week (around 03/13/2022) for F/U with Meter/CGM Edison Simon Only - no Labs.  Glade Lloyd, MD [facility] [address] [place], [place] [address] Phone: [phone]  Fax: [phone]    03/06/2022, 5:56 PM  This note was partially dictated with voice recognition software. Similar sounding words can be transcribed inadequately or may not  be corrected upon review.

## 2022-03-07 ENCOUNTER — Other Ambulatory Visit: Payer: Self-pay | Admitting: "Endocrinology

## 2022-03-07 ENCOUNTER — Telehealth: Payer: Self-pay | Admitting: "Endocrinology

## 2022-03-07 MED ORDER — NOVOLOG MIX 70/30 FLEXPEN (70-30) 100 UNIT/ML ~~LOC~~ SUPN
24.0000 [IU] | PEN_INJECTOR | Freq: Two times a day (BID) | SUBCUTANEOUS | 2 refills | Status: DC
Start: 2022-03-07 — End: 2022-05-21

## 2022-03-07 NOTE — Telephone Encounter (Signed)
Dr.Nida sent in Rx for Novolog.

## 2022-03-07 NOTE — Telephone Encounter (Signed)
Pt is calling and states her insurance does not cover Humalog but it will cover Novolog. She is needing that sent in.  [facility] [place], [place] AT [address] Phone:  [phone]  Fax:  [phone]

## 2022-03-08 ENCOUNTER — Encounter (HOSPITAL_COMMUNITY)
Admission: RE | Admit: 2022-03-08 | Discharge: 2022-03-08 | Disposition: A | Discharge status: Home / Self Care | Payer: Medicare HMO | Source: Ambulatory Visit | Attending: Gastroenterology | Admitting: Gastroenterology

## 2022-03-13 ENCOUNTER — Ambulatory Visit (HOSPITAL_COMMUNITY): Payer: Medicare HMO | Admitting: Anesthesiology

## 2022-03-13 ENCOUNTER — Encounter (HOSPITAL_COMMUNITY): Payer: Self-pay | Admitting: Gastroenterology

## 2022-03-13 ENCOUNTER — Ambulatory Visit (HOSPITAL_COMMUNITY)
Admission: RE | Admit: 2022-03-13 | Discharge: 2022-03-13 | Disposition: A | Discharge status: Home / Self Care | Payer: Medicare HMO | Source: Ambulatory Visit | Attending: Gastroenterology | Admitting: Gastroenterology

## 2022-03-13 ENCOUNTER — Encounter (HOSPITAL_COMMUNITY)
Admission: RE | Disposition: A | Discharge status: Home / Self Care | Payer: Self-pay | Source: Ambulatory Visit | Attending: Gastroenterology

## 2022-03-13 ENCOUNTER — Ambulatory Visit (HOSPITAL_BASED_OUTPATIENT_CLINIC_OR_DEPARTMENT_OTHER): Payer: Medicare HMO | Admitting: Anesthesiology

## 2022-03-13 DIAGNOSIS — I2510 Atherosclerotic heart disease of native coronary artery without angina pectoris: Secondary | ICD-10-CM | POA: Insufficient documentation

## 2022-03-13 DIAGNOSIS — E785 Hyperlipidemia, unspecified: Secondary | ICD-10-CM | POA: Insufficient documentation

## 2022-03-13 DIAGNOSIS — D128 Benign neoplasm of rectum: Secondary | ICD-10-CM | POA: Diagnosis not present

## 2022-03-13 DIAGNOSIS — E119 Type 2 diabetes mellitus without complications: Secondary | ICD-10-CM | POA: Insufficient documentation

## 2022-03-13 DIAGNOSIS — K635 Polyp of colon: Secondary | ICD-10-CM

## 2022-03-13 DIAGNOSIS — K219 Gastro-esophageal reflux disease without esophagitis: Secondary | ICD-10-CM | POA: Insufficient documentation

## 2022-03-13 DIAGNOSIS — K5900 Constipation, unspecified: Secondary | ICD-10-CM | POA: Diagnosis not present

## 2022-03-13 DIAGNOSIS — R1031 Right lower quadrant pain: Secondary | ICD-10-CM | POA: Insufficient documentation

## 2022-03-13 DIAGNOSIS — D123 Benign neoplasm of transverse colon: Secondary | ICD-10-CM | POA: Diagnosis not present

## 2022-03-13 DIAGNOSIS — M797 Fibromyalgia: Secondary | ICD-10-CM | POA: Diagnosis not present

## 2022-03-13 DIAGNOSIS — Z955 Presence of coronary angioplasty implant and graft: Secondary | ICD-10-CM | POA: Diagnosis not present

## 2022-03-13 DIAGNOSIS — I10 Essential (primary) hypertension: Secondary | ICD-10-CM | POA: Insufficient documentation

## 2022-03-13 DIAGNOSIS — Z8673 Personal history of transient ischemic attack (TIA), and cerebral infarction without residual deficits: Secondary | ICD-10-CM | POA: Diagnosis not present

## 2022-03-13 DIAGNOSIS — Z79899 Other long term (current) drug therapy: Secondary | ICD-10-CM | POA: Insufficient documentation

## 2022-03-13 DIAGNOSIS — G20 Parkinson's disease: Secondary | ICD-10-CM | POA: Diagnosis not present

## 2022-03-13 DIAGNOSIS — K5904 Chronic idiopathic constipation: Secondary | ICD-10-CM

## 2022-03-13 HISTORY — PX: POLYPECTOMY: SHX5525

## 2022-03-13 HISTORY — PX: COLONOSCOPY WITH PROPOFOL: SHX5780

## 2022-03-13 LAB — HM COLONOSCOPY

## 2022-03-13 LAB — GLUCOSE, CAPILLARY: Glucose-Capillary: 130 mg/dL — ABNORMAL HIGH (ref 70–99)

## 2022-03-13 SURGERY — COLONOSCOPY WITH PROPOFOL
Anesthesia: General

## 2022-03-13 MED ORDER — LACTATED RINGERS IV SOLN: INTRAVENOUS | Status: DC

## 2022-03-13 MED ORDER — PROPOFOL 10 MG/ML IV BOLUS
INTRAVENOUS | Status: DC | PRN
  Administered 2022-03-13: 90 mg via INTRAVENOUS

## 2022-03-13 MED ORDER — LIDOCAINE HCL (CARDIAC) PF 100 MG/5ML IV SOSY
PREFILLED_SYRINGE | INTRAVENOUS | Status: DC | PRN
  Administered 2022-03-13: 60 mg via INTRAVENOUS

## 2022-03-13 MED ORDER — PROPOFOL 500 MG/50ML IV EMUL
INTRAVENOUS | Status: DC | PRN
  Administered 2022-03-13: 125 ug/kg/min via INTRAVENOUS

## 2022-03-13 NOTE — Op Note (Signed)
[facility] Patient Name: Theresa Hernandez Procedure Date: 03/13/2022 8:42 AM MRN: [id number] Date of Birth: [birth year] Attending MD: [name] ,  CSN: [id number] Age: 81 Admit Type: Outpatient Procedure:                Colonoscopy Indications:              Abdominal pain in the right lower quadrant Providers:                [name], Caprice Kluver, Kristine L. Risa Grill, Technician Referring MD:              Medicines:                Monitored Anesthesia Care Complications:            No immediate complications. Estimated Blood Loss:     Estimated blood loss: none. Procedure:                Pre-Anesthesia Assessment:                           - Prior to the procedure, a History and Physical                            was performed, and patient medications, allergies                            and sensitivities were reviewed. The patient's                            tolerance of previous anesthesia was reviewed.                           - The risks and benefits of the procedure and the                            sedation options and risks were discussed with the                            patient. All questions were answered and informed                            consent was obtained.                           - ASA Grade Assessment: II - A patient with mild                            systemic disease.                           After obtaining informed consent, the colonoscope                            was passed under direct vision. Throughout the  procedure, the patient's blood pressure, pulse, and                            oxygen saturations were monitored continuously. The                            [id number]-[id number] ([id number]) scope was introduced through                            the anus and advanced to the the cecum, identified                            by appendiceal orifice and ileocecal valve. The                             colonoscopy was performed without difficulty. The                            patient tolerated the procedure well. The quality                            of the bowel preparation was good. Scope In: 8:58:45 AM Scope Out: 9:18:44 AM Scope Withdrawal Time: 0 hours 15 minutes 33 seconds  Total Procedure Duration: 0 hours 19 minutes 59 seconds  Findings:      The perianal and digital rectal examinations were normal.      Two sessile polyps were found in the sigmoid colon and transverse colon.       The polyps were 3 to 6 mm in size. These polyps were removed with a cold       snare. Resection and retrieval were complete.      The retroflexed view of the distal rectum and anal verge was normal and       showed no anal or rectal abnormalities. Impression:               - Two 3 to 6 mm polyps in the sigmoid colon and in                            the transverse colon, removed with a cold snare.                            Resected and retrieved.                           - The distal rectum and anal verge are normal on                            retroflexion view. Moderate Sedation:      Per Anesthesia Care Recommendation:           - Discharge patient to home (ambulatory).                           - Resume previous diet.                           -  Await pathology results.                           - Repeat colonoscopy is not recommended due to                            current age (42 years or older) for screening                            purposes.                           -Restart Xarelto tonight                           - Restart Miralax tonight - uptitrate to achieve a                            bowel movememen every day or at least every other                            day. Procedure Code(s):        --- Professional ---                           [id number], Colonoscopy, flexible; with removal of                            tumor(s), polyp(s), or other lesion(s) by snare                             technique Diagnosis Code(s):        --- Professional ---                           K63.5, Polyp of colon                           R10.31, Right lower quadrant pain CPT copyright 2019 American Medical Association. All rights reserved. The codes documented in this report are preliminary and upon coder review may  be revised to meet current compliance requirements. [name], MD [name],  03/13/2022 9:24:19 AM This report has been signed electronically. Number of Addenda: 0

## 2022-03-13 NOTE — Anesthesia Preprocedure Evaluation (Signed)
Anesthesia Evaluation  Patient identified by MRN, date of birth, ID band Patient awake    Reviewed: Allergy & Precautions, H&P , NPO status , Patient's Chart, lab work & pertinent test results, reviewed documented beta blocker date and time   Airway Mallampati: II  TM Distance: >3 FB Neck ROM: full    Dental no notable dental hx.    Pulmonary neg pulmonary ROS,    Pulmonary exam normal breath sounds clear to auscultation       Cardiovascular Exercise Tolerance: Good hypertension, negative cardio ROS   Rhythm:regular Rate:Normal     Neuro/Psych  Neuromuscular disease negative psych ROS   GI/Hepatic Neg liver ROS, GERD  Medicated,  Endo/Other  negative endocrine ROSdiabetes, Type 2  Renal/GU negative Renal ROS  negative genitourinary   Musculoskeletal   Abdominal   Peds  Hematology negative hematology ROS (+)   Anesthesia Other Findings   Reproductive/Obstetrics negative OB ROS                             Anesthesia Physical Anesthesia Plan  ASA: 2  Anesthesia Plan: General   Post-op Pain Management:    Induction:   PONV Risk Score and Plan: Propofol infusion  Airway Management Planned:   Additional Equipment:   Intra-op Plan:   Post-operative Plan:   Informed Consent: I have reviewed the patients History and Physical, chart, labs and discussed the procedure including the risks, benefits and alternatives for the proposed anesthesia with the patient or authorized representative who has indicated his/her understanding and acceptance.     Dental Advisory Given  Plan Discussed with: CRNA  Anesthesia Plan Comments:         Anesthesia Quick Evaluation

## 2022-03-13 NOTE — Discharge Instructions (Addendum)
You are being discharged to home.  Resume your previous diet.  We are waiting for your pathology results.  Your physician has indicated that a repeat colonoscopy is not recommended due to your current age (31 years or older) for screening purposes.  Restart Xarelto tonight Restart Miralax tonight - uptitrate to achieve a bowel movememen every day or at least every other day.

## 2022-03-13 NOTE — Anesthesia Procedure Notes (Signed)
Date/Time: 03/13/2022 8:54 AM  Performed by: Vista Deck, CRNAPre-anesthesia Checklist: Patient identified, Emergency Drugs available, Suction available, Timeout performed and Patient being monitored Patient Re-evaluated:Patient Re-evaluated prior to induction Oxygen Delivery Method: Nasal Cannula

## 2022-03-13 NOTE — Anesthesia Postprocedure Evaluation (Signed)
Anesthesia Post Note  Patient: Theresa Hernandez  Procedure(s) Performed: COLONOSCOPY WITH PROPOFOL POLYPECTOMY  Patient location during evaluation: Phase II Anesthesia Type: General Level of consciousness: awake Pain management: pain level controlled Vital Signs Assessment: post-procedure vital signs reviewed and stable Respiratory status: spontaneous breathing and respiratory function stable Cardiovascular status: blood pressure returned to baseline and stable Postop Assessment: no headache and no apparent nausea or vomiting Anesthetic complications: no Comments: Late entry   No notable events documented.   Last Vitals:  Vitals:   03/13/22 0800 03/13/22 0924  BP: (!) 156/64 (!) 128/53  Pulse: 85 79  Resp: 20   Temp: 37.1 C 36.4 C  SpO2: 97% 98%    Last Pain:  Vitals:   03/13/22 0924  TempSrc: Oral  PainSc: 0-No pain                 Louann Sjogren

## 2022-03-13 NOTE — H&P (Signed)
Theresa Hernandez is an 81 y.o. female.   Chief Complaint: RLQ abdominal pain HPI: Theresa Hernandez is a 81 y.o. female history of diabetes, Parkinson's disease, fibromyalgia, GERD, hypertension, hyperlipidemia, ?cervix cancer, DDD and history of stroke with carotid artery disease s/p stent, who presents for evaluation of RLQ abdominal pain.  She has presented recurrent episodes of right upper quadrant abdominal pain along with constipation chronically.  She is currently on MiraLAX for management of her abdominal pain.  States that her abdominal pain has not changed in severity but has been intermittent throughout time.  It worsened when she was taking her bowel prep but now is not having any pain.  She has a dental abscess and would like to hold off for now on performing an EGD which I find reasonable.  Past Medical History:  Diagnosis Date   Change in stool 05/09/2017   Diabetes ([place])    Fibromyalgia    GERD (gastroesophageal reflux disease)    High cholesterol    Hypertension     Past Surgical History:  Procedure Laterality Date   bust reduction     CARPAL TUNNEL RELEASE     both   ESOPHAGEAL DILATION N/A 09/05/2015   Procedure: ESOPHAGEAL DILATION;  Surgeon: Rogene Houston, MD;  [facility];  Service: Endoscopy;  Laterality: N/A;   ESOPHAGOGASTRODUODENOSCOPY N/A 09/05/2015   Procedure: ESOPHAGOGASTRODUODENOSCOPY (EGD);  Surgeon: Rogene Houston, MD;  [facility];  Service: Endoscopy;  Laterality: N/A;  8:55   KNEE ARTHROSCOPY     LUMBAR EPIDURAL INJECTION     NASAL FRACTURE SURGERY     NECK SURGERY     TUBAL LIGATION      History reviewed. No pertinent family history. Social History:  reports that she has never smoked. She has never used smokeless tobacco. She reports that she does not drink alcohol and does not use drugs.  Allergies:  Allergies  Allergen Reactions   Ciprofloxacin Nausea And Vomiting    Medications Prior to Admission  Medication Sig  Dispense Refill   ALPRAZolam (XANAX) 0.25 MG tablet Take 0.25 mg by mouth at bedtime as needed for anxiety.     Apple Cider Vinegar 600 MG CAPS Take 600 mg by mouth daily.     aspirin 81 MG tablet Take 81 mg by mouth daily.     atorvastatin (LIPITOR) 80 MG tablet Take 80 mg by mouth daily.     b complex vitamins capsule Take 1 capsule by mouth daily.     Biotin 10000 MCG TABS Take 10,000 mcg by mouth daily at 6 (six) AM.     carbidopa-levodopa (SINEMET IR) 10-100 MG tablet Take 1 tablet by mouth 4 (four) times daily.     Cholecalciferol (VITAMIN D) 50 MCG (2000 UT) CAPS Take 2,000 Units by mouth daily.     docusate sodium (COLACE) 100 MG capsule Take 100 mg by mouth daily.     ezetimibe (ZETIA) 10 MG tablet Take 10 mg by mouth daily.     glipiZIDE (GLUCOTROL XL) 5 MG 24 hr tablet TAKE 1 TABLET(5 MG) BY MOUTH DAILY WITH BREAKFAST 90 tablet 0   glucose blood test strip Use to test blood glucose twice daily as instructed 200 each 2   hydrochlorothiazide (HYDRODIURIL) 25 MG tablet Take 25 mg by mouth daily.     insulin aspart protamine - aspart (NOVOLOG MIX 70/30 FLEXPEN) (70-30) 100 UNIT/ML FlexPen Inject 24 Units into the skin 2 (two) times daily with a meal.  15 mL 2   ipratropium (ATROVENT) 0.06 % nasal spray Place 2 sprays into both nostrils 4 (four) times daily as needed for rhinitis.     losartan (COZAAR) 100 MG tablet Take 100 mg by mouth daily.     Multiple Vitamins-Minerals (MULTIVITAMIN WITH MINERALS) tablet Take 1 tablet by mouth daily.     polyethylene glycol-electrolytes (TRILYTE) 420 g solution Take 4,000 mLs by mouth as directed. 4000 mL 0   QUEtiapine (SEROQUEL) 100 MG tablet Take 50 mg by mouth at bedtime as needed (sleep).     sotalol (BETAPACE) 80 MG tablet Take 80 mg by mouth 2 (two) times daily.     XARELTO 20 MG TABS tablet Take 20 mg by mouth daily.     Continuous Blood Gluc Receiver (FREESTYLE LIBRE 2 READER) DEVI As directed 1 each 0   Continuous Blood Gluc Sensor  (FREESTYLE LIBRE 2 SENSOR) MISC 1 Piece by Does not apply route every 14 (fourteen) days. 2 each 3    Results for orders placed or performed during the hospital encounter of 03/13/22 (from the past 48 hour(s))  Glucose, capillary     Status: Abnormal   Collection Time: 03/13/22  8:08 AM  Result Value Ref Range   Glucose-Capillary 130 (H) 70 - 99 mg/dL    Comment: Glucose reference range applies only to samples taken after fasting for at least 8 hours.   No results found.  Review of Systems  Constitutional: Negative.   HENT: Negative.    Eyes: Negative.   Respiratory: Negative.    Cardiovascular: Negative.   Gastrointestinal:  Positive for abdominal pain.  Endocrine: Negative.   Genitourinary: Negative.   Musculoskeletal: Negative.   Skin: Negative.   Allergic/Immunologic: Negative.   Neurological: Negative.   Hematological: Negative.   Psychiatric/Behavioral: Negative.      Blood pressure (!) 156/64, pulse 85, temperature 98.7 F (37.1 C), temperature source Oral, resp. rate 20, height '5\' 4"'$  (1.626 m), weight 70.3 kg, SpO2 97 %. Physical Exam   Assessment/Plan Theresa Hernandez is a 81 y.o. female history of diabetes, Parkinson's disease, fibromyalgia, GERD, hypertension, hyperlipidemia, ?cervix cancer, DDD and history of stroke with carotid artery disease s/p stent, who presents for evaluation of RLQ abdominal pain.  We will proceed today with colonoscopy.  Harvel Quale, MD 03/13/2022, 8:44 AM

## 2022-03-13 NOTE — Transfer of Care (Signed)
Immediate Anesthesia Transfer of Care Note  Patient: Theresa Hernandez  Procedure(s) Performed: COLONOSCOPY WITH PROPOFOL POLYPECTOMY  Patient Location: Short Stay  Anesthesia Type:General  Level of Consciousness: awake  Airway & Oxygen Therapy: Patient Spontanous Breathing  Post-op Assessment: Report given to RN and Post -op Vital signs reviewed and stable  Post vital signs: Reviewed and stable  Last Vitals:  Vitals Value Taken Time  BP 128/53 03/13/22 0924  Temp 36.4 C 03/13/22 0924  Pulse 79 03/13/22 0924  Resp 20   SpO2 98 % 03/13/22 0924    Last Pain:  Vitals:   03/13/22 0924  TempSrc: Oral  PainSc: 0-No pain         Complications: No notable events documented.

## 2022-03-14 LAB — SURGICAL PATHOLOGY

## 2022-03-15 ENCOUNTER — Ambulatory Visit: Payer: Medicare HMO | Admitting: "Endocrinology

## 2022-03-20 ENCOUNTER — Encounter (HOSPITAL_COMMUNITY): Payer: Self-pay | Admitting: Gastroenterology

## 2022-03-20 ENCOUNTER — Encounter (INDEPENDENT_AMBULATORY_CARE_PROVIDER_SITE_OTHER): Payer: Self-pay | Admitting: *Deleted

## 2022-03-22 ENCOUNTER — Ambulatory Visit: Payer: Medicare HMO | Admitting: "Endocrinology

## 2022-03-22 ENCOUNTER — Encounter: Payer: Self-pay | Admitting: "Endocrinology

## 2022-03-22 VITALS — BP 120/62 | HR 68 | Ht 64.0 in | Wt 164.6 lb

## 2022-03-22 DIAGNOSIS — I10 Essential (primary) hypertension: Secondary | ICD-10-CM

## 2022-03-22 DIAGNOSIS — E782 Mixed hyperlipidemia: Secondary | ICD-10-CM

## 2022-03-22 DIAGNOSIS — E1159 Type 2 diabetes mellitus with other circulatory complications: Secondary | ICD-10-CM

## 2022-03-22 NOTE — Progress Notes (Signed)
Reviewed and demonstrated set up and use of Libre 2 reader. Demonstrated and applied Libre 2 sensor to inner upper L arm. Answered pt's and caregiver's questions, understanding voiced. Advised pt if she had any further questions to contact our office, understanding voiced.

## 2022-03-22 NOTE — Patient Instructions (Signed)

## 2022-03-29 ENCOUNTER — Telehealth: Payer: Self-pay | Admitting: "Endocrinology

## 2022-03-29 ENCOUNTER — Encounter (INDEPENDENT_AMBULATORY_CARE_PROVIDER_SITE_OTHER): Payer: Self-pay | Admitting: Gastroenterology

## 2022-03-29 ENCOUNTER — Ambulatory Visit (INDEPENDENT_AMBULATORY_CARE_PROVIDER_SITE_OTHER): Payer: Medicare HMO | Admitting: Gastroenterology

## 2022-03-29 VITALS — BP 134/78 | HR 60 | Temp 97.9°F | Ht 64.0 in | Wt 160.1 lb

## 2022-03-29 DIAGNOSIS — K5904 Chronic idiopathic constipation: Secondary | ICD-10-CM

## 2022-03-29 NOTE — Progress Notes (Signed)
[name], M.D. Gastroenterology & Hepatology [facility] [address] [place], [place] [address]  Primary Care Physician: Pomposini, Cherly Anderson, MD No address on file  I will communicate my assessment and recommendations to the referring MD via EMR.  Problems: Chronic idiopathic constipation  History of Present Illness: Marcelyn Ruppe is a 81 y.o. female history of diabetes, Parkinson's disease, fibromyalgia, GERD, hypertension, hyperlipidemia, ?cervix cancer, DDD and history of stroke with carotid artery disease s/p stent, who comes in follow-up of abdominal pain and constipation.  The patient was last seen on 12/25/2021. At that time, the patient was ordered to have a CBC, CMP, and TSH which were all unremarkable.  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  Notably, there was significant amount of stool in her colon.  The patient brought the CT of the chest, abdomen and pelvis that was performed at [place] center on 10/05/2021 which was negative for any recurrent disease for malignancy.    After her most recent colonoscopy, she was advised to restart taking MiraLAX to achieve better bowel movement frequency.  States that she has presented improvement of her abdominal pain since the last time she was seen in clinic. States that every now and then she has presented episodes of multiple bowel movements, but usually has a BM every day. Stool is usually formed but when she has bouts it is watery. Is not taking Miralax regularly if she feels constipated.  The patient denies having any nausea, vomiting, fever, chills, hematochezia, melena, hematemesis,  abdominal distention, abdominal pain, jaundice, pruritus or weight loss.  Last EGD: 2016 No evidence of erosive esophagitis ring or stricture formation. Few small hyperplastic appearing polyps at gastric body and fundus. These were left alone. Erosive antral gastritis. Esophagus dilated by passing 17 French Maloney dilator to full insertion but no disruption noted to esophageal mucosa.  Last Colonoscopy: 03/13/2022 Two 3 to 6 mm polyps in the sigmoid colon and in the transverse colon, removed with a cold snare. Resected and retrieved. Path : TA x2 No repeat colonoscopy given age  Past Medical History: Past Medical History:  Diagnosis Date   Change in stool 05/09/2017   Diabetes ([place])    Fibromyalgia    GERD (gastroesophageal reflux disease)    High cholesterol    Hypertension     Past Surgical History: Past Surgical History:  Procedure Laterality Date   bust reduction     CARPAL TUNNEL RELEASE     both   COLONOSCOPY WITH PROPOFOL N/A 03/13/2022   Procedure: COLONOSCOPY WITH PROPOFOL;  Surgeon: Harvel Quale, MD;  [facility];  Service: Gastroenterology;  Laterality: N/A;  915 ASA 2   ESOPHAGEAL DILATION N/A 09/05/2015   Procedure: ESOPHAGEAL DILATION;  Surgeon: Rogene Houston, MD;  [facility];  Service: Endoscopy;  Laterality: N/A;   ESOPHAGOGASTRODUODENOSCOPY N/A 09/05/2015   Procedure: ESOPHAGOGASTRODUODENOSCOPY (EGD);  Surgeon: Rogene Houston, MD;  [facility];  Service: Endoscopy;  Laterality: N/A;  8:55   KNEE ARTHROSCOPY     LUMBAR EPIDURAL INJECTION     NASAL FRACTURE SURGERY     NECK SURGERY  POLYPECTOMY  03/13/2022   Procedure: POLYPECTOMY;  Surgeon: Harvel Quale, MD;  [facility];  Service: Gastroenterology;;   TUBAL LIGATION      Family History:History reviewed. No pertinent family history.  Social History: Social History   Tobacco Use  Smoking Status Never  Smokeless Tobacco Never    Social History   Substance and Sexual Activity  Alcohol Use No   Alcohol/week: 0.0 standard drinks of alcohol   Social History   Substance and Sexual Activity  Drug Use No    Allergies: Allergies  Allergen Reactions   Ciprofloxacin Nausea And Vomiting    Medications: Current Outpatient Medications  Medication Sig Dispense Refill   ALPRAZolam (XANAX) 0.25 MG tablet Take 0.25 mg by mouth at bedtime as needed for anxiety.     Apple Cider Vinegar 600 MG CAPS Take 600 mg by mouth daily.     aspirin 81 MG tablet Take 81 mg by mouth daily.     atorvastatin (LIPITOR) 80 MG tablet Take 80 mg by mouth daily.     b complex vitamins capsule Take 1 capsule by mouth daily.     Biotin 10000 MCG TABS Take 10,000 mcg by mouth daily at 6 (six) AM.     carbidopa-levodopa (SINEMET IR) 10-100 MG tablet Take 1 tablet by mouth 4 (four) times daily.     Cholecalciferol (VITAMIN D) 50 MCG (2000 UT) CAPS Take 2,000 Units by mouth daily.     Continuous Blood Gluc Receiver (FREESTYLE LIBRE 2 READER) DEVI As directed 1 each 0   Continuous Blood Gluc Sensor (FREESTYLE LIBRE 2 SENSOR) MISC 1 Piece by Does not apply route every 14 (fourteen) days. 2 each 3   docusate sodium (COLACE) 100 MG capsule Take 100 mg by mouth daily.     ezetimibe (ZETIA) 10 MG tablet Take 10 mg by mouth daily.     glucose blood test strip Use to test blood glucose twice daily as instructed 200 each 2   hydrochlorothiazide (HYDRODIURIL) 25 MG tablet Take 25 mg by mouth daily.     insulin aspart protamine - aspart (NOVOLOG MIX 70/30 FLEXPEN) (70-30) 100 UNIT/ML FlexPen Inject 24 Units into the skin 2 (two) times daily with a meal. 15 mL 2   ipratropium (ATROVENT) 0.06 % nasal spray Place 2 sprays into both nostrils 4 (four) times daily as needed for rhinitis.     losartan (COZAAR) 100 MG tablet Take 100 mg by mouth daily.     Multiple Vitamins-Minerals (MULTIVITAMIN WITH MINERALS) tablet Take 1 tablet by mouth daily.     QUEtiapine  (SEROQUEL) 100 MG tablet Take 50 mg by mouth at bedtime as needed (sleep).     sotalol (BETAPACE) 80 MG tablet Take 80 mg by mouth 2 (two) times daily.     XARELTO 20 MG TABS tablet Take 20 mg by mouth daily.     No current facility-administered medications for this visit.    Review of Systems: GENERAL: negative for malaise, night sweats HEENT: No changes in hearing or vision, no nose bleeds or other nasal problems. NECK: Negative for lumps, goiter, pain and significant neck swelling RESPIRATORY: Negative for cough, wheezing CARDIOVASCULAR: Negative for chest pain, leg swelling, palpitations, orthopnea GI: SEE HPI MUSCULOSKELETAL: Negative for joint pain or swelling, back pain, and muscle pain. SKIN: Negative for lesions, rash PSYCH: Negative for sleep disturbance, mood disorder and recent psychosocial stressors. HEMATOLOGY Negative for prolonged bleeding, bruising easily, and swollen nodes. ENDOCRINE: Negative for cold or  heat intolerance, polyuria, polydipsia and goiter. NEURO: negative for tremor, gait imbalance, syncope and seizures. The remainder of the review of systems is noncontributory.   Physical Exam: BP 134/78 (BP Location: Right Arm, Patient Position: Sitting, Cuff Size: Large)   Pulse 60   Temp 97.9 F (36.6 C) (Oral)   Ht '5\' 4"'$  (1.626 m)   Wt 160 lb 1.6 oz (72.6 kg)   BMI 27.48 kg/m  GENERAL: The patient is AO x3, in no acute distress. HEENT: Head is normocephalic and atraumatic. EOMI are intact. Mouth is well hydrated and without lesions. NECK: Supple. No masses LUNGS: Clear to auscultation. No presence of rhonchi/wheezing/rales. Adequate chest expansion HEART: RRR, normal s1 and s2. ABDOMEN: Soft, nontender, no guarding, no peritoneal signs, and nondistended. BS +. No masses. EXTREMITIES: Without any cyanosis, clubbing, rash, lesions or edema. NEUROLOGIC: AOx3, no focal motor deficit. SKIN: no jaundice, no rashes  Imaging/Labs: as above  I personally  reviewed and interpreted the available labs, imaging and endoscopic files.  Impression and Plan: Vaani Morren is a 81 y.o. female history of diabetes, Parkinson's disease, fibromyalgia, GERD, hypertension, hyperlipidemia, ?cervix cancer, DDD and history of stroke with carotid artery disease s/p stent, who comes in follow-up of abdominal pain and constipation.  The patient underwent endoscopic evaluation recently to rule out a mechanical reason for her constipation and she actually improved with the use of bowel prep for her colonoscopy, currently taking MiraLAX as needed for constipation.  Overall, her symptoms have improved substantially.  She can continue taking the MiraLAX as needed for now.  Of note, she presented some episodes of diarrhea in the past without any other associated red flag signs.  These episodes have been isolated.  I explained to her that given her positive celiac serology, we could consider evaluating this further with an EGD.  However, the patient reported having significant financial hardship and would like to hold off on this for now which I find reasonable as only one of the celiac markers came back positive and she is having very isolated episodes of diarrhea.  - Continue with Miralax as needed for episodes of constipation - if you skip a day moving your bowels  All questions were answered.      Harvel Quale, MD Gastroenterology and Hepatology [facility]

## 2022-03-29 NOTE — Patient Instructions (Signed)
Continue with Miralax as needed for episodes of constipation - if you skip a day moving your bowels

## 2022-03-29 NOTE — Telephone Encounter (Signed)
Patient came by and her readings were high per the [place]. Dr Dorris Fetch said to increase her novolog to 24 units at night and 30 in the morning

## 2022-05-10 ENCOUNTER — Other Ambulatory Visit: Payer: Self-pay | Admitting: "Endocrinology

## 2022-05-19 ENCOUNTER — Other Ambulatory Visit: Payer: Self-pay | Admitting: "Endocrinology

## 2022-05-21 ENCOUNTER — Telehealth: Payer: Self-pay | Admitting: "Endocrinology

## 2022-05-21 DIAGNOSIS — E1159 Type 2 diabetes mellitus with other circulatory complications: Secondary | ICD-10-CM

## 2022-05-21 MED ORDER — NOVOLOG MIX 70/30 FLEXPEN (70-30) 100 UNIT/ML ~~LOC~~ SUPN: PEN_INJECTOR | SUBCUTANEOUS | 0 refills | Status: DC

## 2022-05-21 NOTE — Telephone Encounter (Signed)
New message    1. Which medications need to be refilled? (please list name of each medication and dose if known) NOVOLOG MIX 70/30 FLEXPEN (70-30) 100 UNIT/ML FlexPen  2. Which pharmacy/location (including street and city if local pharmacy) is medication to be sent to?[facility] in [place]    3. Do they need a 30 day or 90 day supply?

## 2022-05-21 NOTE — Telephone Encounter (Signed)
Rx refill sent.

## 2022-05-28 ENCOUNTER — Telehealth: Payer: Self-pay | Admitting: "Endocrinology

## 2022-05-28 NOTE — Telephone Encounter (Signed)
Spoke with pt, gave her the contact number for [facility]. Pt states her glucose has been greater than 200 for the past 2 weeks. Glucose readings for: Saturday 274 before breakfast, 294  before bed. Sunday 241 before breakfast, 287 before bed and this morning's reading 213. Pt is taking novolog 70/30 24 units bid.

## 2022-05-28 NOTE — Telephone Encounter (Signed)
Left a message requesting pt return call to the office. ?

## 2022-05-28 NOTE — Telephone Encounter (Signed)
Pt is requesting a refill on her Sensors.

## 2022-05-29 NOTE — Telephone Encounter (Signed)
Discussed with pt, understanding voiced. 

## 2022-06-25 ENCOUNTER — Ambulatory Visit (INDEPENDENT_AMBULATORY_CARE_PROVIDER_SITE_OTHER): Payer: Medicare HMO | Admitting: "Endocrinology

## 2022-06-25 ENCOUNTER — Encounter: Payer: Self-pay | Admitting: "Endocrinology

## 2022-06-25 VITALS — BP 120/56 | HR 60 | Ht 64.0 in | Wt 159.2 lb

## 2022-06-25 DIAGNOSIS — I10 Essential (primary) hypertension: Secondary | ICD-10-CM | POA: Diagnosis not present

## 2022-06-25 DIAGNOSIS — E782 Mixed hyperlipidemia: Secondary | ICD-10-CM | POA: Diagnosis not present

## 2022-06-25 DIAGNOSIS — E1159 Type 2 diabetes mellitus with other circulatory complications: Secondary | ICD-10-CM | POA: Diagnosis not present

## 2022-06-25 LAB — POCT GLYCOSYLATED HEMOGLOBIN (HGB A1C): HbA1c, POC (controlled diabetic range): 9.2 % — AB (ref 0.0–7.0)

## 2022-06-25 MED ORDER — NOVOLOG MIX 70/30 FLEXPEN (70-30) 100 UNIT/ML ~~LOC~~ SUPN: PEN_INJECTOR | SUBCUTANEOUS | 1 refills | Status: DC

## 2022-06-25 NOTE — Progress Notes (Signed)
06/25/2022, 6:54 PM  Endocrinology follow-up note   Subjective:    Patient ID: Theresa Hernandez, female    DOB: 1940-11-24.  Theresa Hernandez is being seen in follow up after she was seen in  consultation for management of currently her original consult was requested  by  Theresa Quant, MD.   Past Medical History:  Diagnosis Date   Change in stool 05/09/2017   Diabetes ([place])    Fibromyalgia    GERD (gastroesophageal reflux disease)    High cholesterol    Hypertension     Past Surgical History:  Procedure Laterality Date   bust reduction     CARPAL TUNNEL RELEASE     both   COLONOSCOPY WITH PROPOFOL N/A 03/13/2022   Procedure: COLONOSCOPY WITH PROPOFOL;  Surgeon: Harvel Quale, MD;  [facility];  Service: Gastroenterology;  Laterality: N/A;  915 ASA 2   ESOPHAGEAL DILATION N/A 09/05/2015   Procedure: ESOPHAGEAL DILATION;  Surgeon: Rogene Houston, MD;  [facility]: [facility];  Service: Endoscopy;  Laterality: N/A;   ESOPHAGOGASTRODUODENOSCOPY N/A 09/05/2015   Procedure: ESOPHAGOGASTRODUODENOSCOPY (EGD);  Surgeon: Rogene Houston, MD;  [facility];  Service: Endoscopy;  Laterality: N/A;  8:55   KNEE ARTHROSCOPY     LUMBAR EPIDURAL INJECTION     NASAL FRACTURE SURGERY     NECK SURGERY     POLYPECTOMY  03/13/2022   Procedure: POLYPECTOMY;  Surgeon: Montez Morita, Quillian Quince, MD;  Location: [facility];  Service: Gastroenterology;;   TUBAL LIGATION      Social History   Socioeconomic History   Marital status: Divorced    Spouse name: Not on file   Number of children: Not on file   Years of education: Not on file   Highest education level: Not on file  Occupational History   Not on file  Tobacco Use   Smoking status: Never   Smokeless tobacco: Never  Vaping Use   Vaping Use: Never used  Substance and Sexual Activity   Alcohol use: No     Alcohol/week: 0.0 standard drinks of alcohol   Drug use: No   Sexual activity: Not on file  Other Topics Concern   Not on file  Social History Narrative   Not on file   Social Determinants of Health   Financial Resource Strain: Not on file  Food Insecurity: Not on file  Transportation Needs: Not on file  Physical Activity: Not on file  Stress: Not on file  Social Connections: Not on file    History reviewed. No pertinent family history.  Outpatient Encounter Medications as of 06/25/2022  Medication Sig   ALPRAZolam (XANAX) 0.25 MG tablet Take 0.25 mg by mouth at bedtime as needed for anxiety.   Apple Cider Vinegar 600 MG CAPS Take 600 mg by mouth daily.   aspirin 81 MG tablet Take 81 mg by mouth daily.   atorvastatin (LIPITOR) 80 MG tablet Take 80 mg by mouth daily.   b complex vitamins capsule Take 1 capsule by mouth daily.   Biotin 10000 MCG TABS Take 10,000 mcg by mouth  daily at 6 (six) AM.   carbidopa-levodopa (SINEMET IR) 10-100 MG tablet Take 1 tablet by mouth 4 (four) times daily.   Cholecalciferol (VITAMIN D) 50 MCG (2000 UT) CAPS Take 2,000 Units by mouth daily.   Continuous Blood Gluc Receiver (FREESTYLE LIBRE 2 READER) DEVI As directed   Continuous Blood Gluc Sensor (FREESTYLE LIBRE 2 SENSOR) MISC 1 Piece by Does not apply route every 14 (fourteen) days.   docusate sodium (COLACE) 100 MG capsule Take 100 mg by mouth daily.   ezetimibe (ZETIA) 10 MG tablet Take 10 mg by mouth daily.   glucose blood test strip Use to test blood glucose twice daily as instructed   hydrochlorothiazide (HYDRODIURIL) 25 MG tablet Take 25 mg by mouth daily.   insulin aspart protamine - aspart (NOVOLOG MIX 70/30 FLEXPEN) (70-30) 100 UNIT/ML FlexPen ADMINISTER 40 UNITS UNDER THE SKIN TWICE DAILY WITH A MEAL   ipratropium (ATROVENT) 0.06 % nasal spray Place 2 sprays into both nostrils 4 (four) times daily as needed for rhinitis.   losartan (COZAAR) 100 MG tablet Take 100 mg by mouth daily.    Multiple Vitamins-Minerals (MULTIVITAMIN WITH MINERALS) tablet Take 1 tablet by mouth daily.   QUEtiapine (SEROQUEL) 100 MG tablet Take 50 mg by mouth at bedtime as needed (sleep).   sotalol (BETAPACE) 80 MG tablet Take 80 mg by mouth 2 (two) times daily.   XARELTO 20 MG TABS tablet Take 20 mg by mouth daily.   [DISCONTINUED] insulin aspart protamine - aspart (NOVOLOG MIX 70/30 FLEXPEN) (70-30) 100 UNIT/ML FlexPen ADMINISTER 24 UNITS UNDER THE SKIN TWICE DAILY WITH A MEAL (Patient taking differently: ADMINISTER 30 UNITS UNDER THE SKIN TWICE DAILY WITH A MEAL)   No facility-administered encounter medications on file as of 06/25/2022.    ALLERGIES: Allergies  Allergen Reactions   Ciprofloxacin Nausea And Vomiting    VACCINATION STATUS:  There is no immunization history on file for this patient.  Diabetes She presents for her follow-up diabetic visit. She has type 2 diabetes mellitus. Onset time: She was diagnosed at approximate age of 22 years. Her disease course has been fluctuating. There are no hypoglycemic associated symptoms. Pertinent negatives for hypoglycemia include no confusion, headaches, pallor or seizures. Pertinent negatives for diabetes include no blurred vision, no chest pain, no fatigue, no foot paresthesias, no polydipsia, no polyphagia and no polyuria. There are no hypoglycemic complications. Symptoms are improving. 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 Risk factors for coronary artery disease include dyslipidemia, diabetes mellitus, post-menopausal, sedentary lifestyle and tobacco exposure. Current diabetic treatment includes insulin injections. Her weight is increasing steadily. She is following a generally unhealthy diet. When asked about meal planning, she reported none. She has not had a previous visit with a dietitian. She rarely participates in exercise. Her home blood glucose trend is decreasing steadily. Her breakfast blood glucose range is generally  >200 mg/dl. Her lunch blood glucose range is generally >200 mg/dl. Her dinner blood glucose range is generally >200 mg/dl. Her bedtime blood glucose range is generally >200 mg/dl. Her overall blood glucose range is >200 mg/dl. (She presents with continued, mild improvement from her last visit.  Her AGP report shows 27% time range, 18% level 1 hyperglycemia, 55% level 2 hyperglycemia.  This is mainly due to the fact that she does not monitor blood glucose optimally, skips at least a third of her insulin injection opportunities.   Her point-of-care A1c is 9.2%, slightly improving from 9.8% during her last visit.  She did  not document any hypoglycemia.    ) An ACE inhibitor/angiotensin II receptor blocker is being taken. Eye exam is current.  Hyperlipidemia This is a chronic problem. The current episode started more than 1 year ago. Exacerbating diseases include diabetes. Associated symptoms include myalgias. Pertinent negatives include no chest pain or shortness of breath. Current antihyperlipidemic treatment includes statins. Risk factors for coronary artery disease include dyslipidemia, diabetes mellitus, hypertension, a sedentary lifestyle and post-menopausal.  Hypertension This is a chronic problem. The current episode started more than 1 year ago. Pertinent negatives include no blurred vision, chest pain, headaches, palpitations or shortness of breath. Risk factors for coronary artery disease include dyslipidemia, diabetes mellitus, smoking/tobacco exposure, sedentary lifestyle and post-menopausal state. Past treatments include angiotensin blockers. 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     Review of Systems  Constitutional:  Negative for chills, fatigue, fever and unexpected weight change.  HENT:  Negative for trouble swallowing and voice change.   Eyes:  Negative for blurred vision and visual disturbance.  Respiratory:  Negative for cough, shortness of breath and wheezing.    Cardiovascular:  Negative for chest pain, palpitations and leg swelling.  Gastrointestinal:  Negative for diarrhea, nausea and vomiting.  Endocrine: Negative for cold intolerance, heat intolerance, polydipsia, polyphagia and polyuria.  Musculoskeletal:  Positive for arthralgias, back pain and myalgias.  Skin:  Negative for color change, pallor, rash and wound.  Neurological:  Negative for seizures and headaches.  Psychiatric/Behavioral:  Negative for confusion and suicidal ideas.     Objective:       06/25/2022    3:33 PM 03/29/2022    2:18 PM 03/22/2022    3:33 PM  Vitals with BMI  Height '5\' 4"'$  '5\' 4"'$  '5\' 4"'$   Weight 159 lbs 3 oz 160 lbs 2 oz 164 lbs 10 oz  BMI 27.31 46.56 81.27  Systolic 517 001 749  Diastolic 56 78 62  Pulse 60 60 68    BP (!) 120/56   Pulse 60   Ht '5\' 4"'$  (1.626 m)   Wt 159 lb 3.2 oz (72.2 kg)   BMI 27.33 kg/m   Wt Readings from Last 3 Encounters:  06/25/22 159 lb 3.2 oz (72.2 kg)  03/29/22 160 lb 1.6 oz (72.6 kg)  03/22/22 164 lb 9.6 oz (74.7 kg)      CMP ( most recent) CMP     Component Value Date/Time   NA 140 02/23/2022 0000   K 3.7 02/23/2022 0000   CL 108 02/23/2022 0000   CO2 26 (A) 02/23/2022 0000   GLUCOSE 245 (H) 12/25/2021 1533   BUN 21 02/23/2022 0000   CREATININE 0.9 02/23/2022 0000   CREATININE 1.00 02/06/2022 0901   CREATININE 0.78 12/25/2021 1533   CALCIUM 8.9 02/23/2022 0000   PROT 6.0 (L) 12/25/2021 1533   ALBUMIN 3.6 02/23/2022 0000   AST 18 02/23/2022 0000   ALT 15 02/23/2022 0000   ALKPHOS 130 (A) 02/23/2022 0000   BILITOT 0.6 12/25/2021 1533   GFRNONAA >60 08/29/2021 0000   GFRAA >60 08/29/2021 0000     Diabetic Labs (most recent): Lab Results  Component Value Date   HGBA1C 9.2 (A) 06/25/2022   HGBA1C 9.8 (A) 03/06/2022   HGBA1C 8.0 (A) 09/05/2021     Assessment & Plan:   1. DM type 2 causing vascular disease ([place])  - Jadaya Sommerfield has currently uncontrolled symptomatic type 2 DM since  81 years of  age.  She presents with continued, mild improvement from  her last visit.  Her AGP report shows 27% time range, 18% level 1 hyperglycemia, 55% level 2 hyperglycemia.  This is mainly due to the fact that she does not monitor blood glucose optimally, skips at least a third of her insulin injection opportunities.   Her point-of-care A1c is 9.2%, slightly improving from 9.8% during her last visit.  She did not document any hypoglycemia.    She did have A1c as high as 11.2% 3 years ago.  - I had a long discussion with her about the progressive nature of diabetes and the pathology behind its complications. -her diabetes is complicated by coronary artery disease, CVA, several comorbidities.  And she remains at exceedingly high risk for more acute and chronic complications which include CAD, CVA, CKD, retinopathy, and neuropathy. These are all discussed in detail with her.  - I have counseled her on diet  and weight management  by adopting a whole food plant-based diet.   Patient is encouraged to switch to  unprocessed or minimally processed     complex starch and increased protein intake mostly plant source, fruits, and vegetables. -  she is advised to stick to a routine mealtimes to eat 3 meals  a day and avoid unnecessary snacks ( to snack only to correct hypoglycemia).   - she acknowledges that there is a room for improvement in her food and drink choices. - Suggestion is made for her to avoid simple carbohydrates  from her diet including Cakes, Sweet Desserts, Ice Cream, Soda (diet and regular), Sweet Tea, Candies, Chips, Cookies, Store Bought Juices, Alcohol , Artificial Sweeteners,  Coffee Creamer, and "Sugar-free" Products, Lemonade. This will help patient to have more stable blood glucose profile and potentially avoid unintended weight gain.  The following Lifestyle Medicine recommendations according to [place]  [facility]) were discussed and and offered to patient and she   agrees to start the journey:  A. Whole Foods, Plant-Based Nutrition comprising of fruits and vegetables, plant-based proteins, whole-grain carbohydrates was discussed in detail with the patient.   A list for source of those nutrients were also provided to the patient.  Patient will use only water or unsweetened tea for hydration. B.  The need to stay away from risky substances including alcohol, smoking; obtaining 7 to 9 hours of restorative sleep, at least 150 minutes of moderate intensity exercise weekly, the importance of healthy social connections,  and stress management techniques were discussed.   - she has been  scheduled with Jearld Fenton, RDN, CDE for diabetes education.  - I have approached her with the following individualized plan to manage  her diabetes and patient agrees:   -In light of her presentation with loss of control of glycemia, she will need multiple daily injections of insulin for now.    -She is benefiting from her NovoLog 70/30.    She is advised to increase NovoLog 70/30 to 40 units with breakfast, 40 units with supper  associated with monitoring of blood glucose continuously.    - she is warned not to take insulin without proper monitoring per orders.  - she is encouraged to call clinic for blood glucose levels less than 70 or above 200 mg /dl. - she has normal renal function, however patient reports intolerance to Metformin.    -At this time, she is advised to discontinue glipizide.    Plant-based, whole foods lifestyle nutrition was discussed and recommended for her.  - Specific targets for  A1c;  LDL,  HDL,  and Triglycerides were discussed with the patient.  2) Blood Pressure /Hypertension:  -Pressure is controlled to target.  she is advised to continue her current medications including losartan 100 mg p.o. daily with breakfast .  3) Lipids/Hyperlipidemia: No recent lipid panel to review.  He is advised to continue atorvastatin 80 mg p.o. nightly.    She  is also on ezetimibe 10 mg p.o. daily.    Side effects and precautions discussed with her.    4)  Weight/Diet:  Body mass index is 27.33 kg/m.  -She is regaining the weight loss she has achieved prior to last visit.  I discussed with her the fact that loss of 5 - 10% of her  current body weight will have the most impact on her diabetes management.  Exercise, and detailed carbohydrates information provided  -  detailed on discharge instructions.  5) Chronic Care/Health Maintenance:  -she  is on ACEI/ARB and Statin medications and  is encouraged to initiate and continue to follow up with Ophthalmology, Dentist,  Podiatrist at least yearly or according to recommendations, and advised to   stay away from smoking. I have recommended yearly flu vaccine and pneumonia vaccine at least every 5 years; moderate intensity exercise for up to 150 minutes weekly; and  sleep for at least 7 hours a day.  Point-of-care ABI for PAD was normal in February 2022.  Her test will be repeated in February 2027 or sooner if needed.  - she is  advised to maintain close follow up with [place], Cherly Anderson, MD for primary care needs, as well as her other providers for optimal and coordinated care.      I spent 34 minutes in the care of the patient today including review of labs from [facility], Lipids, Thyroid Function, Hematology (current and previous including abstractions from other facilities); face-to-face time discussing  her blood glucose readings/logs, discussing hypoglycemia and hyperglycemia episodes and symptoms, medications doses, her options of short and long term treatment based on the latest standards of care / guidelines;  discussion about incorporating lifestyle medicine;  and documenting the encounter. Risk reduction counseling performed per USPSTF guidelines to reduce  obesity and cardiovascular risk factors.     Please refer to Patient Instructions for Blood Glucose Monitoring and Insulin/Medications Dosing Guide"   in media tab for additional information. Please  also refer to " Patient Self Inventory" in the Media  tab for reviewed elements of pertinent patient history.  Dickey Gave participated in the discussions, expressed understanding, and voiced agreement with the above plans.  All questions were answered to her satisfaction. she is encouraged to contact clinic should she have any questions or concerns prior to her return visit.   Follow up plan: - Return in about 3 months (around 09/24/2022) for F/U with Pre-visit Labs, Meter/CGM/Logs, A1c here.  Glade Lloyd, MD [facility] [address] [place], [place] [address] Phone: [phone]  Fax: [phone]    06/25/2022, 6:54 PM  This note was partially dictated with voice recognition software. Similar sounding words can be transcribed inadequately or may not  be corrected upon review.

## 2022-06-25 NOTE — Patient Instructions (Signed)

## 2022-07-21 ENCOUNTER — Other Ambulatory Visit: Payer: Self-pay | Admitting: "Endocrinology

## 2022-07-21 DIAGNOSIS — E1159 Type 2 diabetes mellitus with other circulatory complications: Secondary | ICD-10-CM

## 2022-08-11 ENCOUNTER — Other Ambulatory Visit: Payer: Self-pay | Admitting: "Endocrinology

## 2022-08-11 DIAGNOSIS — E1159 Type 2 diabetes mellitus with other circulatory complications: Secondary | ICD-10-CM

## 2022-08-31 ENCOUNTER — Other Ambulatory Visit: Payer: Self-pay | Admitting: "Endocrinology

## 2022-08-31 DIAGNOSIS — E1159 Type 2 diabetes mellitus with other circulatory complications: Secondary | ICD-10-CM

## 2022-09-27 ENCOUNTER — Ambulatory Visit (INDEPENDENT_AMBULATORY_CARE_PROVIDER_SITE_OTHER): Payer: Medicare HMO | Admitting: "Endocrinology

## 2022-09-27 ENCOUNTER — Encounter: Payer: Self-pay | Admitting: "Endocrinology

## 2022-09-27 VITALS — BP 130/64 | HR 56 | Ht 64.0 in | Wt 164.0 lb

## 2022-09-27 DIAGNOSIS — I10 Essential (primary) hypertension: Secondary | ICD-10-CM

## 2022-09-27 DIAGNOSIS — E1159 Type 2 diabetes mellitus with other circulatory complications: Secondary | ICD-10-CM

## 2022-09-27 DIAGNOSIS — E782 Mixed hyperlipidemia: Secondary | ICD-10-CM

## 2022-09-27 LAB — POCT GLYCOSYLATED HEMOGLOBIN (HGB A1C): HbA1c, POC (controlled diabetic range): 8.4 % — AB (ref 0.0–7.0)

## 2022-09-27 IMAGING — CT CT ABD-PELV W/ CM
2 of 5 series · 17 of 46 positions shown, 19 images · IV contrast (Omnipaque or Isovue)
Comparison: None Available.

CLINICAL DATA: Right lower quadrant abdominal pain

EXAM:
CT ABDOMEN AND PELVIS WITH CONTRAST
TECHNIQUE: Multidetector CT imaging of the abdomen and pelvis was performed
using the standard protocol following bolus administration of
intravenous contrast.

[Series 2: axial st · axial · 0.77mm/px · z∈[+884,+1309]mm · 14 of 97 slices shown, 16 images]
[im 6/97  soft-tissue]
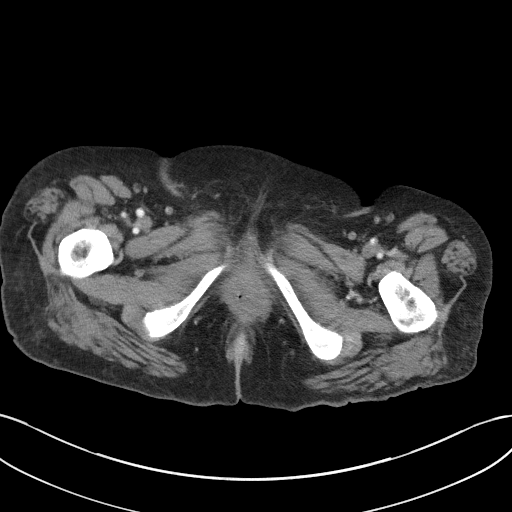
[im 6/97  bone]
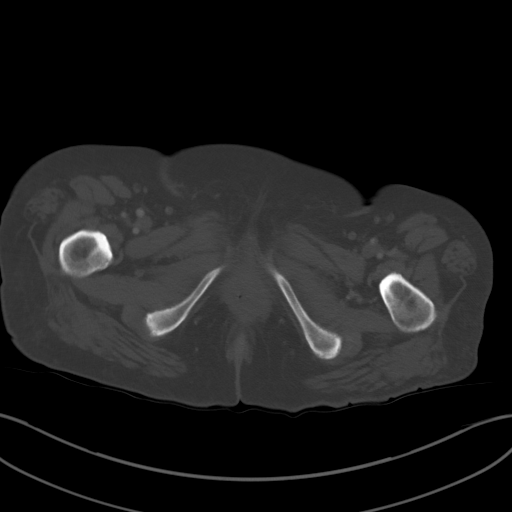
[im 11/97  soft-tissue]
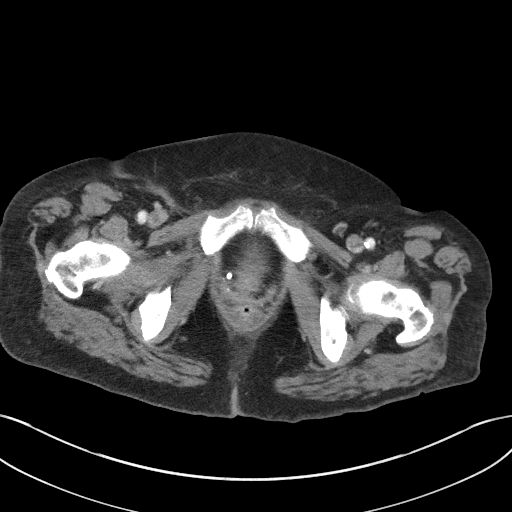
[im 22/97  soft-tissue]
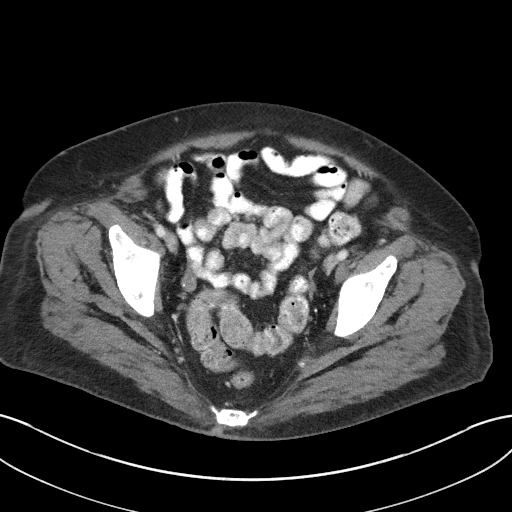
[im 27/97  soft-tissue]
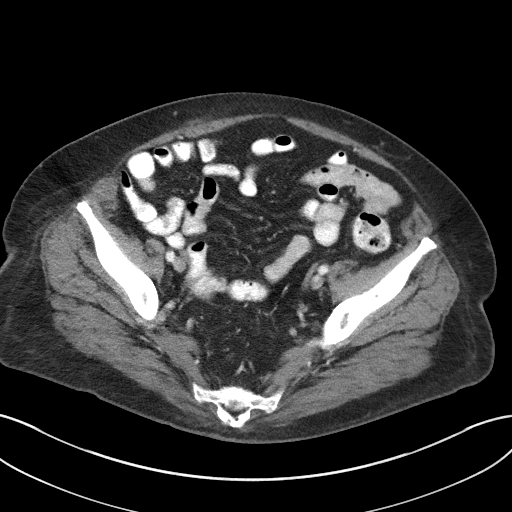
[im 33/97  soft-tissue]
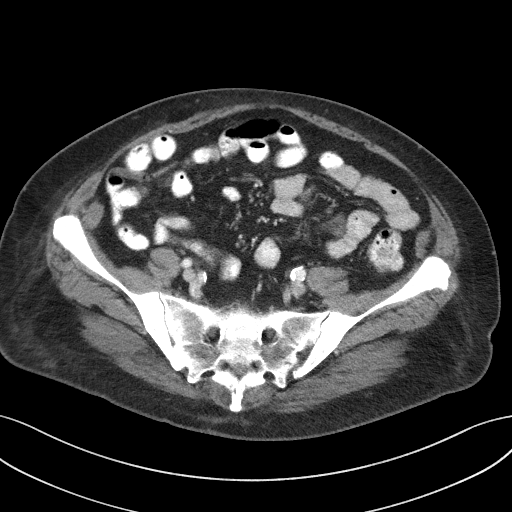
[im 38/97  soft-tissue]
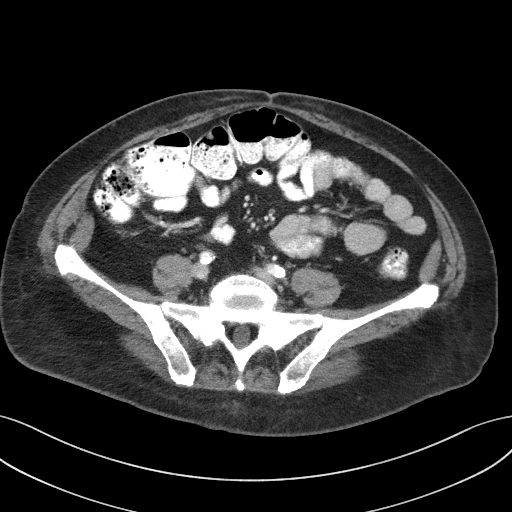
[im 43/97  soft-tissue]
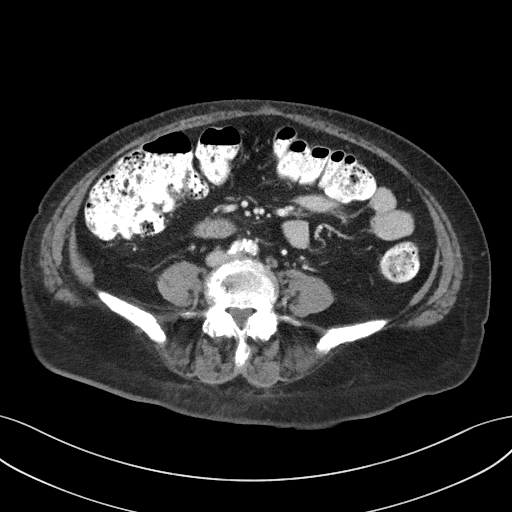
[im 54/97  soft-tissue]
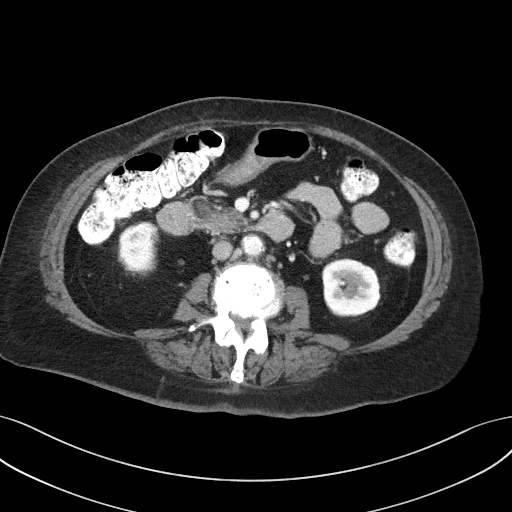
[im 59/97  soft-tissue]
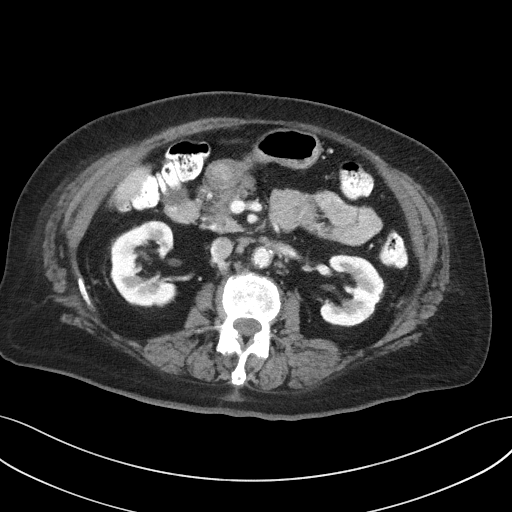
[im 59/97  bone]
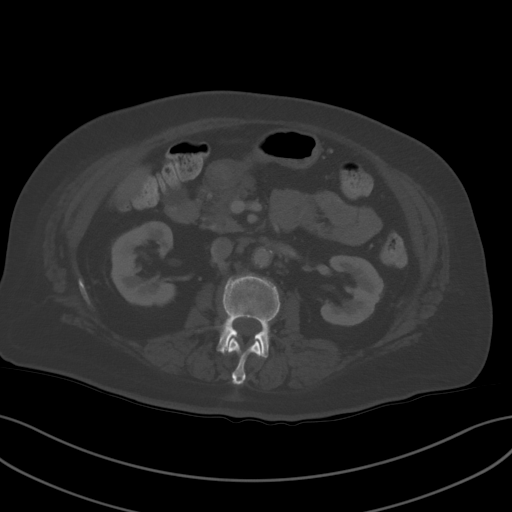
[im 65/97  soft-tissue]
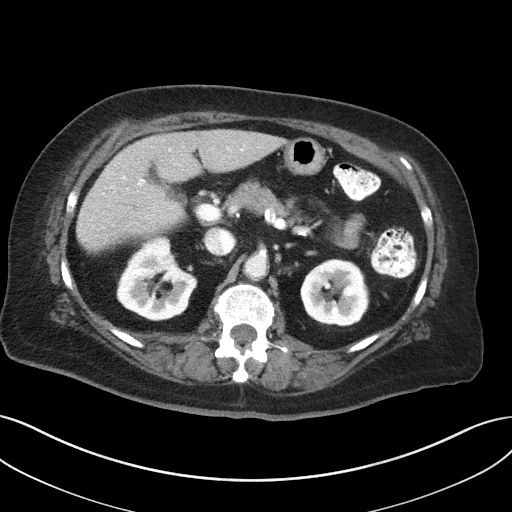
[im 70/97  soft-tissue]
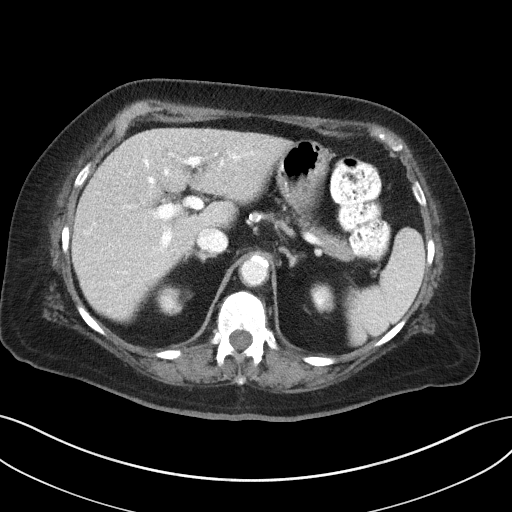
[im 75/97  soft-tissue]
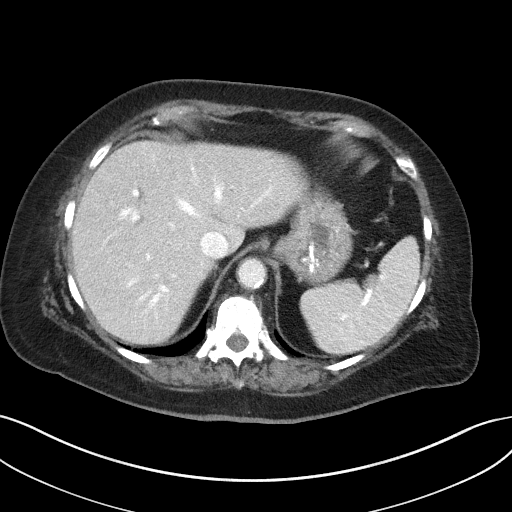
[im 86/97  soft-tissue]
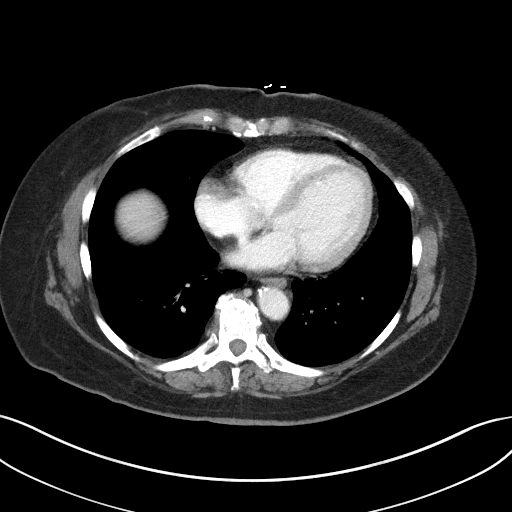
[im 91/97  soft-tissue]
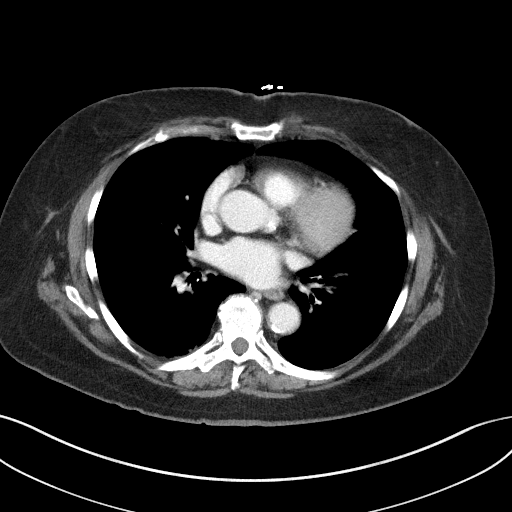

[Series 5: coronal st · coronal · 0.85mm/px · 3 of 116 slices shown]
[im 39/116  soft-tissue]
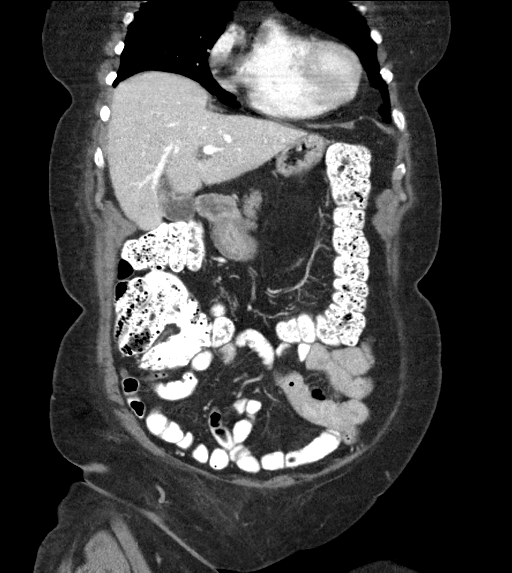
[im 52/116  soft-tissue]
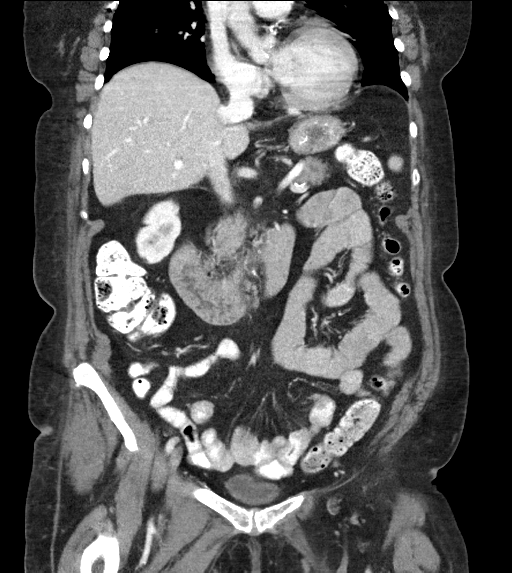
[im 64/116  soft-tissue]
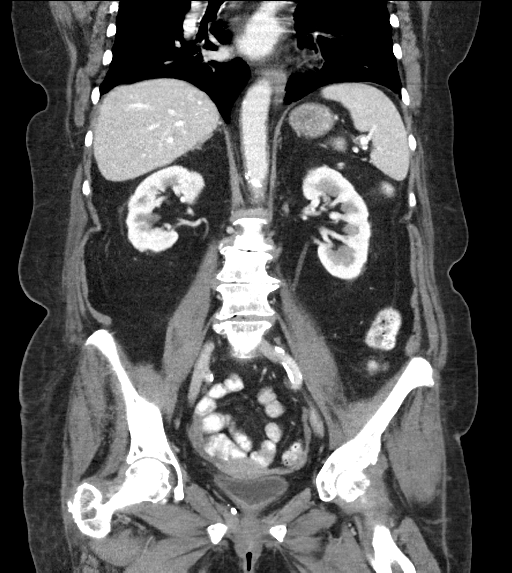

[17 of 46 positions shown; findings below may reference images not displayed]

RADIATION DOSE REDUCTION: This exam was performed according to the
departmental dose-optimization program which includes automated
exposure control, adjustment of the mA and/or kV according to
patient size and/or use of iterative reconstruction technique.

CONTRAST:  100mL OMNIPAQUE IOHEXOL 300 MG/ML  SOLN
FINDINGS: Lower chest: No acute abnormality. Coronary artery and aortic
calcifications.

Hepatobiliary: Layering gallstones within the gallbladder. No focal
hepatic abnormality or biliary ductal dilatation.

Pancreas: No focal abnormality or ductal dilatation.

Spleen: No focal abnormality.  Normal size.

Adrenals/Urinary Tract: No adrenal abnormality. No focal renal
abnormality. No stones or hydronephrosis. Urinary bladder is
unremarkable.

Stomach/Bowel: Stomach, large and small bowel grossly unremarkable.
Normal appendix.

Vascular/Lymphatic: Diffuse aortoiliac atherosclerosis. No evidence
of aneurysm or adenopathy.

Reproductive: Uterus and adnexa unremarkable.  No mass.

Other: No free fluid or free air.

Musculoskeletal: No acute bony abnormality.
IMPRESSION: Cholelithiasis.

Aortic atherosclerosis.

Normal appendix.

No acute findings.

## 2022-09-27 MED ORDER — NOVOLOG MIX 70/30 FLEXPEN (70-30) 100 UNIT/ML ~~LOC~~ SUPN: PEN_INJECTOR | SUBCUTANEOUS | 1 refills | Status: DC

## 2022-09-27 MED ORDER — GLIPIZIDE ER 2.5 MG PO TB24: 2.5000 mg | ORAL_TABLET | Freq: Every day | ORAL | 1 refills | Status: DC

## 2022-09-27 MED ORDER — DEXCOM G7 SENSOR MISC: 2 refills | Status: AC

## 2022-09-27 MED ORDER — DEXCOM G7 RECEIVER DEVI: 0 refills | Status: AC

## 2022-09-27 NOTE — Patient Instructions (Signed)

## 2022-09-27 NOTE — Progress Notes (Signed)
09/27/2022, 8:28 PM  Endocrinology follow-up note   Subjective:    Patient ID: Theresa Hernandez, female    DOB: [birth year].  Theresa Hernandez is being seen in follow up after she was seen in  consultation for management of currently her original consult was requested  by  Clinton Quant, MD.   Past Medical History:  Diagnosis Date   Change in stool 05/09/2017   Diabetes ([place])    Fibromyalgia    GERD (gastroesophageal reflux disease)    High cholesterol    Hypertension     Past Surgical History:  Procedure Laterality Date   bust reduction     CARPAL TUNNEL RELEASE     both   COLONOSCOPY WITH PROPOFOL N/A 03/13/2022   Procedure: COLONOSCOPY WITH PROPOFOL;  Surgeon: Harvel Quale, MD;  [facility];  Service: Gastroenterology;  Laterality: N/A;  915 ASA 2   ESOPHAGEAL DILATION N/A 09/05/2015   Procedure: ESOPHAGEAL DILATION;  Surgeon: Rogene Houston, MD;  [facility];  Service: Endoscopy;  Laterality: N/A;   ESOPHAGOGASTRODUODENOSCOPY N/A 09/05/2015   Procedure: ESOPHAGOGASTRODUODENOSCOPY (EGD);  Surgeon: Rogene Houston, MD;  [facility];  Service: Endoscopy;  Laterality: N/A;  8:55   KNEE ARTHROSCOPY     LUMBAR EPIDURAL INJECTION     NASAL FRACTURE SURGERY     NECK SURGERY     POLYPECTOMY  03/13/2022   Procedure: POLYPECTOMY;  Surgeon: Montez Morita, Quillian Quince, MD;  [facility];  Service: Gastroenterology;;   TUBAL LIGATION      Social History   Socioeconomic History   Marital status: Divorced    Spouse name: Not on file   Number of children: Not on file   Years of education: Not on file   Highest education level: Not on file  Occupational History   Not on file  Tobacco Use   Smoking status: Never   Smokeless tobacco: Never  Vaping Use   Vaping Use: Never used  Substance and Sexual Activity   Alcohol use: No     Alcohol/week: 0.0 standard drinks of alcohol   Drug use: No   Sexual activity: Not on file  Other Topics Concern   Not on file  Social History Narrative   Not on file   Social Determinants of Health   Financial Resource Strain: Not on file  Food Insecurity: Not on file  Transportation Needs: Not on file  Physical Activity: Not on file  Stress: Not on file  Social Connections: Not on file    History reviewed. No pertinent family history.  Outpatient Encounter Medications as of 09/27/2022  Medication Sig   Continuous Blood Gluc Receiver (DEXCOM G7 RECEIVER) DEVI Use to monitor BG continuously   Continuous Blood Gluc Sensor (DEXCOM G7 SENSOR) MISC Change sensor every 10 days   glipiZIDE (GLUCOTROL XL) 2.5 MG 24 hr tablet Take 1 tablet (2.5 mg total) by mouth daily with breakfast.   ALPRAZolam (XANAX) 0.25 MG tablet Take 0.25 mg by mouth at bedtime as needed for anxiety.   Apple Cider Vinegar 600 MG CAPS Take 600 mg  by mouth daily.   aspirin 81 MG tablet Take 81 mg by mouth daily.   atorvastatin (LIPITOR) 80 MG tablet Take 80 mg by mouth daily.   b complex vitamins capsule Take 1 capsule by mouth daily.   Biotin 10000 MCG TABS Take 10,000 mcg by mouth daily at 6 (six) AM.   carbidopa-levodopa (SINEMET IR) 10-100 MG tablet Take 1 tablet by mouth 4 (four) times daily.   Cholecalciferol (VITAMIN D) 50 MCG (2000 UT) CAPS Take 2,000 Units by mouth daily.   docusate sodium (COLACE) 100 MG capsule Take 100 mg by mouth daily.   ezetimibe (ZETIA) 10 MG tablet Take 10 mg by mouth daily.   glucose blood (ONETOUCH VERIO) test strip USE AS DIRECTED TWICE DAILY   hydrochlorothiazide (HYDRODIURIL) 25 MG tablet Take 25 mg by mouth daily.   insulin aspart protamine - aspart (NOVOLOG MIX 70/30 FLEXPEN) (70-30) 100 UNIT/ML FlexPen Inject 50 units with breakfast and 40 units with supper only if glucose readings are above 90 and eating.   ipratropium (ATROVENT) 0.06 % nasal spray Place 2 sprays into  both nostrils 4 (four) times daily as needed for rhinitis.   losartan (COZAAR) 100 MG tablet Take 100 mg by mouth daily.   Multiple Vitamins-Minerals (MULTIVITAMIN WITH MINERALS) tablet Take 1 tablet by mouth daily.   QUEtiapine (SEROQUEL) 100 MG tablet Take 50 mg by mouth at bedtime as needed (sleep).   sotalol (BETAPACE) 80 MG tablet Take 80 mg by mouth 2 (two) times daily.   XARELTO 20 MG TABS tablet Take 20 mg by mouth daily.   [DISCONTINUED] Continuous Blood Gluc Receiver (FREESTYLE LIBRE 2 READER) DEVI As directed   [DISCONTINUED] Continuous Blood Gluc Sensor (FREESTYLE LIBRE 2 SENSOR) MISC 1 Piece by Does not apply route every 14 (fourteen) days.   [DISCONTINUED] NOVOLOG MIX 70/30 FLEXPEN (70-30) 100 UNIT/ML FlexPen ADMINISTER 40 UNITS UNDER THE SKIN TWICE DAILY WITH A MEAL   No facility-administered encounter medications on file as of 09/27/2022.    ALLERGIES: Allergies  Allergen Reactions   Ciprofloxacin Nausea And Vomiting    VACCINATION STATUS:  There is no immunization history on file for this patient.  Diabetes She presents for her follow-up diabetic visit. She has type 2 diabetes mellitus. Onset time: She was diagnosed at approximate age of 73 years. Her disease course has been improving. There are no hypoglycemic associated symptoms. Pertinent negatives for hypoglycemia include no confusion, headaches, pallor or seizures. Pertinent negatives for diabetes include no blurred vision, no chest pain, no fatigue, no foot paresthesias, no polydipsia, no polyphagia and no polyuria. There are no hypoglycemic complications. Symptoms are improving. 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 Risk factors for coronary artery disease include dyslipidemia, diabetes mellitus, post-menopausal, sedentary lifestyle and tobacco exposure. Current diabetic treatment includes insulin injections. Her weight is increasing steadily. She is following a generally unhealthy diet. When asked  about meal planning, she reported none. She has not had a previous visit with a dietitian. She rarely participates in exercise. Her home blood glucose trend is decreasing steadily. Her breakfast blood glucose range is generally 180-200 mg/dl. Her lunch blood glucose range is generally 180-200 mg/dl. Her dinner blood glucose range is generally 180-200 mg/dl. Her bedtime blood glucose range is generally 180-200 mg/dl. Her overall blood glucose range is 180-200 mg/dl. (She presents with continued, mild improvement from her last visit.  Her AGP report shows 42% time in range, 24% level 1 hyperglycemia, 33% level 2 hyperglycemia.  Her point-of-care  A1c is improving to 8.4% from 9.2%.  She did not document any hypoglycemia.  She is requesting for Dexcom instead of Libre for dexterity issues.   ) An ACE inhibitor/angiotensin II receptor blocker is being taken. Eye exam is current.  Hyperlipidemia This is a chronic problem. The current episode started more than 1 year ago. The problem is controlled. Exacerbating diseases include diabetes. Associated symptoms include myalgias. Pertinent negatives include no chest pain or shortness of breath. Current antihyperlipidemic treatment includes statins. Risk factors for coronary artery disease include dyslipidemia, diabetes mellitus, hypertension, a sedentary lifestyle and post-menopausal.  Hypertension This is a chronic problem. The current episode started more than 1 year ago. Pertinent negatives include no blurred vision, chest pain, headaches, palpitations or shortness of breath. Risk factors for coronary artery disease include dyslipidemia, diabetes mellitus, smoking/tobacco exposure, sedentary lifestyle and post-menopausal state. Past treatments include angiotensin blockers. 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     Review of Systems  Constitutional:  Negative for chills, fatigue, fever and unexpected weight change.  HENT:  Negative for trouble  swallowing and voice change.   Eyes:  Negative for blurred vision and visual disturbance.  Respiratory:  Negative for cough, shortness of breath and wheezing.   Cardiovascular:  Negative for chest pain, palpitations and leg swelling.  Gastrointestinal:  Negative for diarrhea, nausea and vomiting.  Endocrine: Negative for cold intolerance, heat intolerance, polydipsia, polyphagia and polyuria.  Musculoskeletal:  Positive for arthralgias, back pain and myalgias.  Skin:  Negative for color change, pallor, rash and wound.  Neurological:  Negative for seizures and headaches.  Psychiatric/Behavioral:  Negative for confusion and suicidal ideas.     Objective:       09/27/2022    3:33 PM 06/25/2022    3:33 PM 03/29/2022    2:18 PM  Vitals with BMI  Height '5\' 4"'$  '5\' 4"'$  '5\' 4"'$   Weight 164 lbs 159 lbs 3 oz 160 lbs 2 oz  BMI 28.14 76.28 31.51  Systolic 761 607 371  Diastolic 64 56 78  Pulse 56 60 60    BP 130/64   Pulse (!) 56   Ht '5\' 4"'$  (1.626 m)   Wt 164 lb (74.4 kg)   BMI 28.15 kg/m   Wt Readings from Last 3 Encounters:  09/27/22 164 lb (74.4 kg)  06/25/22 159 lb 3.2 oz (72.2 kg)  03/29/22 160 lb 1.6 oz (72.6 kg)      CMP ( most recent) CMP     Component Value Date/Time   NA 140 02/23/2022 0000   K 3.7 02/23/2022 0000   CL 108 02/23/2022 0000   CO2 26 (A) 02/23/2022 0000   GLUCOSE 245 (H) 12/25/2021 1533   BUN 21 02/23/2022 0000   CREATININE 0.9 02/23/2022 0000   CREATININE 1.00 02/06/2022 0901   CREATININE 0.78 12/25/2021 1533   CALCIUM 8.9 02/23/2022 0000   PROT 6.0 (L) 12/25/2021 1533   ALBUMIN 3.6 02/23/2022 0000   AST 18 02/23/2022 0000   ALT 15 02/23/2022 0000   ALKPHOS 130 (A) 02/23/2022 0000   BILITOT 0.6 12/25/2021 1533   GFRNONAA >60 08/29/2021 0000   GFRAA >60 08/29/2021 0000     Diabetic Labs (most recent): Lab Results  Component Value Date   HGBA1C 8.4 (A) 09/27/2022   HGBA1C 9.2 (A) 06/25/2022   HGBA1C 9.8 (A) 03/06/2022     Assessment &  Plan:   1. DM type 2 causing vascular disease ([place])  - Theresa Hernandez has currently  uncontrolled symptomatic type 2 DM since  81 years of age.  She presents with continued, mild improvement from her last visit.  Her AGP report shows 42% time in range, 24% level 1 hyperglycemia, 33% level 2 hyperglycemia.  Her point-of-care A1c is improving to 8.4% from 9.2%.  She did not document any hypoglycemia.  She is requesting for Dexcom instead of Libre for dexterity issues.  - I had a long discussion with her about the progressive nature of diabetes and the pathology behind its complications. -her diabetes is complicated by coronary artery disease, CVA, several comorbidities.  And she remains at exceedingly high risk for more acute and chronic complications which include CAD, CVA, CKD, retinopathy, and neuropathy. These are all discussed in detail with her.  - I have counseled her on diet  and weight management  by adopting a whole food plant-based diet.   Patient is encouraged to switch to  unprocessed or minimally processed     complex starch and increased protein intake mostly plant source, fruits, and vegetables. -  she is advised to stick to a routine mealtimes to eat 3 meals  a day and avoid unnecessary snacks ( to snack only to correct hypoglycemia).   - she acknowledges that there is a room for improvement in her food and drink choices. - Suggestion is made for her to avoid simple carbohydrates  from her diet including Cakes, Sweet Desserts, Ice Cream, Soda (diet and regular), Sweet Tea, Candies, Chips, Cookies, Store Bought Juices, Alcohol , Artificial Sweeteners,  Coffee Creamer, and "Sugar-free" Products, Lemonade. This will help patient to have more stable blood glucose profile and potentially avoid unintended weight gain.  The following Lifestyle Medicine recommendations according to [place]  [facility]) were discussed and and offered to patient and she  agrees to start  the journey:  A. Whole Foods, Plant-Based Nutrition comprising of fruits and vegetables, plant-based proteins, whole-grain carbohydrates was discussed in detail with the patient.   A list for source of those nutrients were also provided to the patient.  Patient will use only water or unsweetened tea for hydration. B.  The need to stay away from risky substances including alcohol, smoking; obtaining 7 to 9 hours of restorative sleep, at least 150 minutes of moderate intensity exercise weekly, the importance of healthy social connections,  and stress management techniques were discussed. C.  A full color page of  Calorie density of various food groups per pound showing examples of each food groups was provided to the patient.   - she has been  scheduled with Jearld Fenton, RDN, CDE for diabetes education.  - I have approached her with the following individualized plan to manage  her diabetes and patient agrees:   She is benefiting from her current regimen of premixed NovoLog 70/30.    She is advised to increase NovoLog 70/30 to 50 units with breakfast, 40 units with supper  associated with monitoring of blood glucose continuously.    - she is warned not to take insulin without proper monitoring per orders. -She wishes to have Dexcom instead of freestyle libre device for dexterity issues.  I prescribed this device for her.  - she is encouraged to call clinic for blood glucose levels less than 70 or above 200 mg /dl. - she has normal renal function, however patient reports intolerance to Metformin.    -She will like to resume glipizide . She will be re-started on glipizide 2.5 mg po daily at  breakfast.    Plant-based, whole foods lifestyle nutrition was discussed and recommended for her.  - Specific targets for  A1c;  LDL, HDL,  and Triglycerides were discussed with the patient.  2) Blood Pressure /Hypertension:  -Her blood pressure is controlled to target.  she is advised to continue her  current medications including losartan 100 mg p.o. daily with breakfast .  3) Lipids/Hyperlipidemia: Recent labs show controlled LDL.  He will be considered for lower dose of atorvastatin on next refill.  In the meantime, she is advised to continue atorvastatin 80 mg p.o. nightly.    She is also on ezetimibe 10 mg p.o. daily.    Side effects and precautions discussed with her.    4)  Weight/Diet:  Body mass index is 28.15 kg/m.  -She is regaining the weight loss she has achieved prior to last visit.  I discussed with her the fact that loss of 5 - 10% of her  current body weight will have the most impact on her diabetes management.  Exercise, and detailed carbohydrates information provided  -  detailed on discharge instructions.  5) Chronic Care/Health Maintenance:  -she  is on ACEI/ARB and Statin medications and  is encouraged to initiate and continue to follow up with Ophthalmology, Dentist,  Podiatrist at least yearly or according to recommendations, and advised to   stay away from smoking. I have recommended yearly flu vaccine and pneumonia vaccine at least every 5 years; moderate intensity exercise for up to 150 minutes weekly; and  sleep for at least 7 hours a day.  Point-of-care ABI for PAD was normal in February 2022.  Her test will be repeated in February 2027 or sooner if needed.  - she is  advised to maintain close follow up with [place], Cherly Anderson, MD for primary care needs, as well as her other providers for optimal and coordinated care.   I spent 26 minutes in the care of the patient today including review of labs from [place], Lipids, Thyroid Function, Hematology (current and previous including abstractions from other facilities); face-to-face time discussing  her blood glucose readings/logs, discussing hypoglycemia and hyperglycemia episodes and symptoms, medications doses, her options of short and long term treatment based on the latest standards of care / guidelines;  discussion about  incorporating lifestyle medicine;  and documenting the encounter. Risk reduction counseling performed per USPSTF guidelines to reduce  cardiovascular risk factors.     Please refer to Patient Instructions for Blood Glucose Monitoring and Insulin/Medications Dosing Guide"  in media tab for additional information. Please  also refer to " Patient Self Inventory" in the Media  tab for reviewed elements of pertinent patient history.  Dickey Gave participated in the discussions, expressed understanding, and voiced agreement with the above plans.  All questions were answered to her satisfaction. she is encouraged to contact clinic should she have any questions or concerns prior to her return visit.    Follow up plan: - Return in about 4 months (around 01/27/2023) for Bring Meter/CGM Device/Logs- A1c in Office.  Glade Lloyd, MD [facility] [address] [place], [place] [address] Phone: [phone]  Fax: [phone]    09/27/2022, 8:28 PM  This note was partially dictated with voice recognition software. Similar sounding words can be transcribed inadequately or may not  be corrected upon review.

## 2022-09-28 ENCOUNTER — Telehealth: Payer: Self-pay

## 2022-09-28 NOTE — Telephone Encounter (Signed)
Rx for Dexcom G7 CGM system sent to [facility].

## 2022-10-24 ENCOUNTER — Telehealth: Payer: Self-pay

## 2022-10-24 NOTE — Telephone Encounter (Signed)
Left a message requesting a return call to the office. 

## 2022-12-28 ENCOUNTER — Other Ambulatory Visit: Payer: Self-pay | Admitting: "Endocrinology

## 2023-01-08 ENCOUNTER — Encounter (INDEPENDENT_AMBULATORY_CARE_PROVIDER_SITE_OTHER): Payer: Self-pay | Admitting: Gastroenterology

## 2023-01-17 ENCOUNTER — Encounter (INDEPENDENT_AMBULATORY_CARE_PROVIDER_SITE_OTHER): Payer: Self-pay | Admitting: Gastroenterology

## 2023-01-28 ENCOUNTER — Encounter: Payer: Self-pay | Admitting: "Endocrinology

## 2023-01-28 ENCOUNTER — Ambulatory Visit: Payer: Medicare HMO | Admitting: "Endocrinology

## 2023-01-28 VITALS — BP 116/52 | HR 60 | Ht 64.0 in | Wt 163.4 lb

## 2023-01-28 DIAGNOSIS — E782 Mixed hyperlipidemia: Secondary | ICD-10-CM

## 2023-01-28 DIAGNOSIS — I10 Essential (primary) hypertension: Secondary | ICD-10-CM | POA: Diagnosis not present

## 2023-01-28 DIAGNOSIS — E1159 Type 2 diabetes mellitus with other circulatory complications: Secondary | ICD-10-CM | POA: Diagnosis not present

## 2023-01-28 LAB — POCT GLYCOSYLATED HEMOGLOBIN (HGB A1C): HbA1c, POC (controlled diabetic range): 6.1 % (ref 0.0–7.0)

## 2023-01-28 MED ORDER — NOVOLOG MIX 70/30 FLEXPEN (70-30) 100 UNIT/ML ~~LOC~~ SUPN: PEN_INJECTOR | SUBCUTANEOUS | 1 refills | Status: DC

## 2023-01-28 NOTE — Patient Instructions (Signed)

## 2023-01-28 NOTE — Progress Notes (Unsigned)
01/28/2023, 8:12 PM  Endocrinology follow-up note   Subjective:    Patient ID: Theresa Hernandez, female    DOB: Feb 10, 1941.  Mazikeen Hehn is being seen in follow up after she was seen in  consultation for management of currently her original consult was requested  by  Eldridge Abrahams, MD.   Past Medical History:  Diagnosis Date   Change in stool 05/09/2017   Diabetes (HCC)    Fibromyalgia    GERD (gastroesophageal reflux disease)    High cholesterol    Hypertension     Past Surgical History:  Procedure Laterality Date   bust reduction     CARPAL TUNNEL RELEASE     both   COLONOSCOPY WITH PROPOFOL N/A 03/13/2022   Procedure: COLONOSCOPY WITH PROPOFOL;  Surgeon: Dolores Frame, MD;  Location: [facility];  Service: Gastroenterology;  Laterality: N/A;  915 ASA 2   ESOPHAGEAL DILATION N/A 09/05/2015   Procedure: ESOPHAGEAL DILATION;  Surgeon: Malissa Hippo, MD;  Location: [facility];  Service: Endoscopy;  Laterality: N/A;   ESOPHAGOGASTRODUODENOSCOPY N/A 09/05/2015   Procedure: ESOPHAGOGASTRODUODENOSCOPY (EGD);  Surgeon: Malissa Hippo, MD;  Location: [facility];  Service: Endoscopy;  Laterality: N/A;  8:55   KNEE ARTHROSCOPY     LUMBAR EPIDURAL INJECTION     NASAL FRACTURE SURGERY     NECK SURGERY     POLYPECTOMY  03/13/2022   Procedure: POLYPECTOMY;  Surgeon: Marguerita Merles, Reuel Boom, MD;  Location: [facility];  Service: Gastroenterology;;   TUBAL LIGATION      Social History   Socioeconomic History   Marital status: Divorced    Spouse name: Not on file   Number of children: Not on file   Years of education: Not on file   Highest education level: Not on file  Occupational History   Not on file  Tobacco Use   Smoking status: Never   Smokeless tobacco: Never  Vaping Use   Vaping Use: Never used  Substance and Sexual Activity   Alcohol use: No     Alcohol/week: 0.0 standard drinks of alcohol   Drug use: No   Sexual activity: Not on file  Other Topics Concern   Not on file  Social History Narrative   Not on file   Social Determinants of Health   Financial Resource Strain: Not on file  Food Insecurity: Not on file  Transportation Needs: Not on file  Physical Activity: Not on file  Stress: Not on file  Social Connections: Not on file    History reviewed. No pertinent family history.  Outpatient Encounter Medications as of 01/28/2023  Medication Sig   carbidopa-levodopa (SINEMET IR) 25-100 MG tablet Take 1 tablet by mouth 3 (three) times daily.   OVER THE COUNTER MEDICATION H.A. Joint Formula 3 tablets daily   ALPRAZolam (XANAX) 0.25 MG tablet Take 0.25 mg by mouth at bedtime as needed for anxiety.   Apple Cider Vinegar 600 MG CAPS Take 600 mg by mouth daily.   aspirin 81 MG tablet Take 81 mg by mouth daily.   atorvastatin (LIPITOR) 80 MG tablet  Take 80 mg by mouth daily.   b complex vitamins capsule Take 1 capsule by mouth daily.   Biotin 40981 MCG TABS Take 10,000 mcg by mouth daily at 6 (six) AM.   Cholecalciferol (VITAMIN D) 50 MCG (2000 UT) CAPS Take 2,000 Units by mouth daily.   Continuous Blood Gluc Receiver (DEXCOM G7 RECEIVER) DEVI Use to monitor BG continuously   Continuous Blood Gluc Sensor (DEXCOM G7 SENSOR) MISC Change sensor every 10 days   docusate sodium (COLACE) 100 MG capsule Take 100 mg by mouth daily.   ezetimibe (ZETIA) 10 MG tablet Take 10 mg by mouth daily.   glipiZIDE (GLUCOTROL XL) 2.5 MG 24 hr tablet TAKE 1 TABLET(2.5 MG) BY MOUTH DAILY WITH BREAKFAST   glucose blood (ONETOUCH VERIO) test strip USE AS DIRECTED TWICE DAILY   hydrochlorothiazide (HYDRODIURIL) 25 MG tablet Take 25 mg by mouth daily.   insulin aspart protamine - aspart (NOVOLOG MIX 70/30 FLEXPEN) (70-30) 100 UNIT/ML FlexPen Inject 40 units with breakfast and 30 units with supper only if glucose readings are above 90 and eating.    ipratropium (ATROVENT) 0.06 % nasal spray Place 2 sprays into both nostrils 4 (four) times daily as needed for rhinitis.   losartan (COZAAR) 100 MG tablet Take 100 mg by mouth daily.   Multiple Vitamins-Minerals (MULTIVITAMIN WITH MINERALS) tablet Take 1 tablet by mouth daily.   sotalol (BETAPACE) 80 MG tablet Take 80 mg by mouth 2 (two) times daily.   XARELTO 20 MG TABS tablet Take 20 mg by mouth daily.   [DISCONTINUED] carbidopa-levodopa (SINEMET IR) 10-100 MG tablet Take 1 tablet by mouth 4 (four) times daily.   [DISCONTINUED] insulin aspart protamine - aspart (NOVOLOG MIX 70/30 FLEXPEN) (70-30) 100 UNIT/ML FlexPen Inject 50 units with breakfast and 40 units with supper only if glucose readings are above 90 and eating.   [DISCONTINUED] QUEtiapine (SEROQUEL) 100 MG tablet Take 50 mg by mouth at bedtime as needed (sleep).   No facility-administered encounter medications on file as of 01/28/2023.    ALLERGIES: Allergies  Allergen Reactions   Ciprofloxacin Nausea And Vomiting    VACCINATION STATUS: There is no immunization history for the selected administration types on file for this patient.  Diabetes She presents for her follow-up diabetic visit. She has type 2 diabetes mellitus. Onset time: She was diagnosed at approximate age of 52 years. Her disease course has been improving. There are no hypoglycemic associated symptoms. Pertinent negatives for hypoglycemia include no confusion, headaches, pallor or seizures. Pertinent negatives for diabetes include no blurred vision, no chest pain, no fatigue, no foot paresthesias, no polydipsia, no polyphagia and no polyuria. There are no hypoglycemic complications. Symptoms are improving. 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 Risk factors for coronary artery disease include dyslipidemia, diabetes mellitus, post-menopausal, sedentary lifestyle and tobacco exposure. Current diabetic treatment includes insulin injections. Her weight is  increasing steadily. She is following a generally unhealthy diet. When asked about meal planning, she reported none. She has not had a previous visit with a dietitian. She rarely participates in exercise. Her home blood glucose trend is decreasing steadily. Her breakfast blood glucose range is generally 180-200 mg/dl. Her lunch blood glucose range is generally 180-200 mg/dl. Her dinner blood glucose range is generally 180-200 mg/dl. Her bedtime blood glucose range is generally 180-200 mg/dl. Her overall blood glucose range is 180-200 mg/dl. (She presents with continued, mild improvement from her last visit.  Her AGP report shows 42% time in range, 24% level  1 hyperglycemia, 33% level 2 hyperglycemia.  Her point-of-care A1c is improving to 8.4% from 9.2%.  She did not document any hypoglycemia.  She is requesting for Dexcom instead of Libre for dexterity issues.   ) An ACE inhibitor/angiotensin II receptor blocker is being taken. Eye exam is current.  Hyperlipidemia This is a chronic problem. The current episode started more than 1 year ago. The problem is controlled. Exacerbating diseases include diabetes. Associated symptoms include myalgias. Pertinent negatives include no chest pain or shortness of breath. Current antihyperlipidemic treatment includes statins. Risk factors for coronary artery disease include dyslipidemia, diabetes mellitus, hypertension, a sedentary lifestyle and post-menopausal.  Hypertension This is a chronic problem. The current episode started more than 1 year ago. Pertinent negatives include no blurred vision, chest pain, headaches, palpitations or shortness of breath. Risk factors for coronary artery disease include dyslipidemia, diabetes mellitus, smoking/tobacco exposure, sedentary lifestyle and post-menopausal state. Past treatments include angiotensin blockers. 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     Review of Systems  Constitutional:  Negative for chills,  fatigue, fever and unexpected weight change.  HENT:  Negative for trouble swallowing and voice change.   Eyes:  Negative for blurred vision and visual disturbance.  Respiratory:  Negative for cough, shortness of breath and wheezing.   Cardiovascular:  Negative for chest pain, palpitations and leg swelling.  Gastrointestinal:  Negative for diarrhea, nausea and vomiting.  Endocrine: Negative for cold intolerance, heat intolerance, polydipsia, polyphagia and polyuria.  Musculoskeletal:  Positive for arthralgias, back pain and myalgias.  Skin:  Negative for color change, pallor, rash and wound.  Neurological:  Negative for seizures and headaches.  Psychiatric/Behavioral:  Negative for confusion and suicidal ideas.     Objective:       01/28/2023    3:16 PM 09/27/2022    3:33 PM 06/25/2022    3:33 PM  Vitals with BMI  Height 5\' 4"  5\' 4"  5\' 4"   Weight 163 lbs 6 oz 164 lbs 159 lbs 3 oz  BMI 28.03 28.14 27.31  Systolic 116 130 409  Diastolic 52 64 56  Pulse 60 56 60    BP (!) 116/52   Pulse 60   Ht 5\' 4"  (1.626 m)   Wt 163 lb 6.4 oz (74.1 kg)   BMI 28.05 kg/m   Wt Readings from Last 3 Encounters:  01/28/23 163 lb 6.4 oz (74.1 kg)  09/27/22 164 lb (74.4 kg)  06/25/22 159 lb 3.2 oz (72.2 kg)      CMP ( most recent) CMP     Component Value Date/Time   NA 140 02/23/2022 0000   K 3.7 02/23/2022 0000   CL 108 02/23/2022 0000   CO2 26 (A) 02/23/2022 0000   GLUCOSE 245 (H) 12/25/2021 1533   BUN 21 02/23/2022 0000   CREATININE 0.9 02/23/2022 0000   CREATININE 1.00 02/06/2022 0901   CREATININE 0.78 12/25/2021 1533   CALCIUM 8.9 02/23/2022 0000   PROT 6.0 (L) 12/25/2021 1533   ALBUMIN 3.6 02/23/2022 0000   AST 18 02/23/2022 0000   ALT 15 02/23/2022 0000   ALKPHOS 130 (A) 02/23/2022 0000   BILITOT 0.6 12/25/2021 1533   GFRNONAA >60 08/29/2021 0000   GFRAA >60 08/29/2021 0000     Diabetic Labs (most recent): Lab Results  Component Value Date   HGBA1C 6.1 01/28/2023    HGBA1C 8.4 (A) 09/27/2022   HGBA1C 9.2 (A) 06/25/2022     Assessment & Plan:   1. DM type 2  causing vascular disease (HCC)  - Roiza Wiedel has currently uncontrolled symptomatic type 2 DM since  82 years of age.  She presents with continued, mild improvement from her last visit.  Her AGP report shows 42% time in range, 24% level 1 hyperglycemia, 33% level 2 hyperglycemia.  Her point-of-care A1c is improving to 8.4% from 9.2%.  She did not document any hypoglycemia.  She is requesting for Dexcom instead of Libre for dexterity issues.  - I had a long discussion with her about the progressive nature of diabetes and the pathology behind its complications. -her diabetes is complicated by coronary artery disease, CVA, several comorbidities.  And she remains at exceedingly high risk for more acute and chronic complications which include CAD, CVA, CKD, retinopathy, and neuropathy. These are all discussed in detail with her.  - I have counseled her on diet  and weight management  by adopting a whole food plant-based diet.   Patient is encouraged to switch to  unprocessed or minimally processed     complex starch and increased protein intake mostly plant source, fruits, and vegetables. -  she is advised to stick to a routine mealtimes to eat 3 meals  a day and avoid unnecessary snacks ( to snack only to correct hypoglycemia).   - she acknowledges that there is a room for improvement in her food and drink choices. - Suggestion is made for her to avoid simple carbohydrates  from her diet including Cakes, Sweet Desserts, Ice Cream, Soda (diet and regular), Sweet Tea, Candies, Chips, Cookies, Store Bought Juices, Alcohol , Artificial Sweeteners,  Coffee Creamer, and "Sugar-free" Products, Lemonade. This will help patient to have more stable blood glucose profile and potentially avoid unintended weight gain.  The following Lifestyle Medicine recommendations according to American College of Lifestyle Medicine   [facility]) were discussed and and offered to patient and she  agrees to start the journey:  A. Whole Foods, Plant-Based Nutrition comprising of fruits and vegetables, plant-based proteins, whole-grain carbohydrates was discussed in detail with the patient.   A list for source of those nutrients were also provided to the patient.  Patient will use only water or unsweetened tea for hydration. B.  The need to stay away from risky substances including alcohol, smoking; obtaining 7 to 9 hours of restorative sleep, at least 150 minutes of moderate intensity exercise weekly, the importance of healthy social connections,  and stress management techniques were discussed. C.  A full color page of  Calorie density of various food groups per pound showing examples of each food groups was provided to the patient.   - she has been  scheduled with Norm Salt, RDN, CDE for diabetes education.  - I have approached her with the following individualized plan to manage  her diabetes and patient agrees:   She is benefiting from her current regimen of premixed NovoLog 70/30.    She is advised to increase NovoLog 70/30 to 50 units with breakfast, 40 units with supper  associated with monitoring of blood glucose continuously.    - she is warned not to take insulin without proper monitoring per orders. -She wishes to have Dexcom instead of freestyle libre device for dexterity issues.  I prescribed this device for her.  - she is encouraged to call clinic for blood glucose levels less than 70 or above 200 mg /dl. - she has normal renal function, however patient reports intolerance to Metformin.    -She will like to resume glipizide . She  will be re-started on glipizide 2.5 mg po daily at breakfast.    Plant-based, whole foods lifestyle nutrition was discussed and recommended for her.  - Specific targets for  A1c;  LDL, HDL,  and Triglycerides were discussed with the patient.  2) Blood Pressure /Hypertension:  -Her  blood pressure is controlled to target.  she is advised to continue her current medications including losartan 100 mg p.o. daily with breakfast .  3) Lipids/Hyperlipidemia: Recent labs show controlled LDL.  He will be considered for lower dose of atorvastatin on next refill.  In the meantime, she is advised to continue atorvastatin 80 mg p.o. nightly.    She is also on ezetimibe 10 mg p.o. daily.    Side effects and precautions discussed with her.    4)  Weight/Diet:  Body mass index is 28.05 kg/m.  -She is regaining the weight loss she has achieved prior to last visit.  I discussed with her the fact that loss of 5 - 10% of her  current body weight will have the most impact on her diabetes management.  Exercise, and detailed carbohydrates information provided  -  detailed on discharge instructions.  5) Chronic Care/Health Maintenance:  -she  is on ACEI/ARB and Statin medications and  is encouraged to initiate and continue to follow up with Ophthalmology, Dentist,  Podiatrist at least yearly or according to recommendations, and advised to   stay away from smoking. I have recommended yearly flu vaccine and pneumonia vaccine at least every 5 years; moderate intensity exercise for up to 150 minutes weekly; and  sleep for at least 7 hours a day.  Point-of-care ABI for PAD was normal in February 2022.  Her test will be repeated in February 2027 or sooner if needed.  - she is  advised to maintain close follow up with Pomposini, Rande Brunt, MD for primary care needs, as well as her other providers for optimal and coordinated care.   I spent 26 minutes in the care of the patient today including review of labs from CMP, Lipids, Thyroid Function, Hematology (current and previous including abstractions from other facilities); face-to-face time discussing  her blood glucose readings/logs, discussing hypoglycemia and hyperglycemia episodes and symptoms, medications doses, her options of short and long term  treatment based on the latest standards of care / guidelines;  discussion about incorporating lifestyle medicine;  and documenting the encounter. Risk reduction counseling performed per USPSTF guidelines to reduce  cardiovascular risk factors.     Please refer to Patient Instructions for Blood Glucose Monitoring and Insulin/Medications Dosing Guide"  in media tab for additional information. Please  also refer to " Patient Self Inventory" in the Media  tab for reviewed elements of pertinent patient history.  Joesph Fillers participated in the discussions, expressed understanding, and voiced agreement with the above plans.  All questions were answered to her satisfaction. she is encouraged to contact clinic should she have any questions or concerns prior to her return visit.    Follow up plan: - Return in about 4 months (around 05/30/2023) for F/U with Pre-visit Labs, Meter/CGM/Logs, A1c here.  Marquis Lunch, MD [facility] [address] [place], [place] [address] Phone: [phone]  Fax: [phone]    01/28/2023, 8:12 PM  This note was partially dictated with voice recognition software. Similar sounding words can be transcribed inadequately or may not  be corrected upon review.

## 2023-03-01 ENCOUNTER — Other Ambulatory Visit: Payer: Self-pay | Admitting: "Endocrinology

## 2023-03-25 ENCOUNTER — Ambulatory Visit (INDEPENDENT_AMBULATORY_CARE_PROVIDER_SITE_OTHER): Payer: Medicare HMO | Admitting: Gastroenterology

## 2023-03-28 ENCOUNTER — Ambulatory Visit (INDEPENDENT_AMBULATORY_CARE_PROVIDER_SITE_OTHER): Payer: Medicare HMO | Admitting: Gastroenterology

## 2023-04-18 ENCOUNTER — Other Ambulatory Visit: Payer: Self-pay | Admitting: "Endocrinology

## 2023-04-18 DIAGNOSIS — E1159 Type 2 diabetes mellitus with other circulatory complications: Secondary | ICD-10-CM

## 2023-05-02 ENCOUNTER — Ambulatory Visit (INDEPENDENT_AMBULATORY_CARE_PROVIDER_SITE_OTHER): Payer: Medicare HMO | Admitting: Gastroenterology

## 2023-05-29 LAB — COMPREHENSIVE METABOLIC PANEL
BUN: 41 mg/dL — ABNORMAL HIGH (ref 8–27)
eGFR: 49 mL/min/{1.73_m2} — ABNORMAL LOW (ref >59)

## 2023-06-04 ENCOUNTER — Encounter: Payer: Self-pay | Admitting: "Endocrinology

## 2023-06-04 ENCOUNTER — Ambulatory Visit: Payer: Medicare HMO | Admitting: "Endocrinology

## 2023-06-04 VITALS — BP 118/56 | HR 60 | Ht 64.0 in | Wt 157.4 lb

## 2023-06-04 DIAGNOSIS — Z794 Long term (current) use of insulin: Secondary | ICD-10-CM | POA: Insufficient documentation

## 2023-06-04 DIAGNOSIS — E782 Mixed hyperlipidemia: Secondary | ICD-10-CM | POA: Diagnosis not present

## 2023-06-04 DIAGNOSIS — I10 Essential (primary) hypertension: Secondary | ICD-10-CM

## 2023-06-04 DIAGNOSIS — E1159 Type 2 diabetes mellitus with other circulatory complications: Secondary | ICD-10-CM

## 2023-06-04 LAB — POCT GLYCOSYLATED HEMOGLOBIN (HGB A1C): HbA1c, POC (controlled diabetic range): 6 % (ref 0.0–7.0)

## 2023-06-04 MED ORDER — EMPAGLIFLOZIN 10 MG PO TABS
10.0000 mg | ORAL_TABLET | Freq: Every day | ORAL | 1 refills | Status: DC
Start: 2023-06-04 — End: 2023-12-05

## 2023-06-04 MED ORDER — NOVOLOG MIX 70/30 FLEXPEN (70-30) 100 UNIT/ML ~~LOC~~ SUPN: PEN_INJECTOR | SUBCUTANEOUS | 1 refills | Status: DC

## 2023-06-04 NOTE — Patient Instructions (Signed)

## 2023-06-04 NOTE — Progress Notes (Signed)
06/04/2023, 4:41 PM  Endocrinology follow-up note   Subjective:    Patient ID: Theresa Hernandez, female    DOB: 1941-01-29.  Theresa Hernandez is being seen in follow up after she was seen in  consultation for management of currently her original consult was requested  by  Eldridge Abrahams, MD.   Past Medical History:  Diagnosis Date   Change in stool 05/09/2017   Diabetes (HCC)    Fibromyalgia    GERD (gastroesophageal reflux disease)    High cholesterol    Hypertension     Past Surgical History:  Procedure Laterality Date   bust reduction     CARPAL TUNNEL RELEASE     both   COLONOSCOPY WITH PROPOFOL N/A 03/13/2022   Procedure: COLONOSCOPY WITH PROPOFOL;  Surgeon: Dolores Frame, MD;  [facility]: [facility];  Service: Gastroenterology;  Laterality: N/A;  915 ASA 2   ESOPHAGEAL DILATION N/A 09/05/2015   Procedure: ESOPHAGEAL DILATION;  Surgeon: Malissa Hippo, MD;  Location: [facility];  Service: Endoscopy;  Laterality: N/A;   ESOPHAGOGASTRODUODENOSCOPY N/A 09/05/2015   Procedure: ESOPHAGOGASTRODUODENOSCOPY (EGD);  Surgeon: Malissa Hippo, MD;  Location: [facility];  Service: Endoscopy;  Laterality: N/A;  8:55   KNEE ARTHROSCOPY     LUMBAR EPIDURAL INJECTION     NASAL FRACTURE SURGERY     NECK SURGERY     POLYPECTOMY  03/13/2022   Procedure: POLYPECTOMY;  Surgeon: Marguerita Merles, Reuel Boom, MD;  Location: [facility];  Service: Gastroenterology;;   TUBAL LIGATION      Social History   Socioeconomic History   Marital status: Divorced    Spouse name: Not on file   Number of children: Not on file   Years of education: Not on file   Highest education level: Not on file  Occupational History   Not on file  Tobacco Use   Smoking status: Never   Smokeless tobacco: Never  Vaping Use   Vaping status: Never Used  Substance and Sexual Activity   Alcohol use: No     Alcohol/week: 0.0 standard drinks of alcohol   Drug use: No   Sexual activity: Not on file  Other Topics Concern   Not on file  Social History Narrative   Not on file   Social Determinants of Health   Financial Resource Strain: Not on file  Food Insecurity: Not on file  Transportation Needs: Not on file  Physical Activity: Not on file  Stress: Not on file  Social Connections: Not on file    History reviewed. No pertinent family history.  Outpatient Encounter Medications as of 06/04/2023  Medication Sig   empagliflozin (JARDIANCE) 10 MG TABS tablet Take 1 tablet (10 mg total) by mouth daily before breakfast.   ALPRAZolam (XANAX) 0.25 MG tablet Take 0.25 mg by mouth at bedtime as needed for anxiety.   Apple Cider Vinegar 600 MG CAPS Take 600 mg by mouth daily.   aspirin 81 MG tablet Take 81 mg by mouth daily.   atorvastatin (LIPITOR) 80 MG tablet Take 80 mg by mouth daily.   b complex  vitamins capsule Take 1 capsule by mouth daily.   Biotin [address]109 MCG TABS Take 10,000 mcg by mouth daily at 6 (six) AM.   carbidopa-levodopa (SINEMET IR) 25-100 MG tablet Take 1 tablet by mouth 3 (three) times daily.   Cholecalciferol (VITAMIN D) 50 MCG (2000 UT) CAPS Take 2,000 Units by mouth daily.   Continuous Blood Gluc Receiver (DEXCOM G7 RECEIVER) DEVI Use to monitor BG continuously   Continuous Blood Gluc Sensor (DEXCOM G7 SENSOR) MISC Change sensor every 10 days   docusate sodium (COLACE) 100 MG capsule Take 100 mg by mouth daily.   ezetimibe (ZETIA) 10 MG tablet Take 10 mg by mouth daily.   glucose blood (ONETOUCH VERIO) test strip USE AS DIRECTED TWICE DAILY   hydrochlorothiazide (HYDRODIURIL) 25 MG tablet Take 25 mg by mouth daily.   insulin aspart protamine - aspart (NOVOLOG MIX 70/30 FLEXPEN) (70-30) 100 UNIT/ML FlexPen INJECT 40 UNITS UNDER SKIN WITH BREAKFAST AND 30 UNITS WITH SUPPER ONLY IF GLUCOSE READINGS ARE ABOVE 90 AND EATING.   ipratropium (ATROVENT) 0.06 % nasal spray Place 2  sprays into both nostrils 4 (four) times daily as needed for rhinitis.   losartan (COZAAR) 100 MG tablet Take 100 mg by mouth daily.   Multiple Vitamins-Minerals (MULTIVITAMIN WITH MINERALS) tablet Take 1 tablet by mouth daily.   OVER THE COUNTER MEDICATION H.A. Joint Formula 3 tablets daily   sotalol (BETAPACE) 80 MG tablet Take 80 mg by mouth 2 (two) times daily.   XARELTO 20 MG TABS tablet Take 20 mg by mouth daily.   [DISCONTINUED] glipiZIDE (GLUCOTROL XL) 2.5 MG 24 hr tablet TAKE 1 TABLET(2.5 MG) BY MOUTH DAILY WITH BREAKFAST   [DISCONTINUED] insulin aspart protamine - aspart (NOVOLOG MIX 70/30 FLEXPEN) (70-30) 100 UNIT/ML FlexPen INJECT 40 UNITS UNDER SKIN WITH BREAKFAST AND 30 UNITS WITH SUPPER ONLY IF GLUCOSE READINGS ARE ABOVE 90 AND EATING.   No facility-administered encounter medications on file as of 06/04/2023.    ALLERGIES: Allergies  Allergen Reactions   Ciprofloxacin Nausea And Vomiting    VACCINATION STATUS: There is no immunization history for the selected administration types on file for this patient.  Diabetes She presents for her follow-up diabetic visit. She has type 2 diabetes mellitus. Onset time: She was diagnosed at approximate age of 52 years. Her disease course has been stable. There are no hypoglycemic associated symptoms. Pertinent negatives for hypoglycemia include no confusion, headaches, pallor or seizures. Pertinent negatives for diabetes include no blurred vision, no chest pain, no fatigue, no foot paresthesias, no polydipsia, no polyphagia and no polyuria. There are no hypoglycemic complications. Symptoms are stable. 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 Risk factors for coronary artery disease include dyslipidemia, diabetes mellitus, post-menopausal, sedentary lifestyle and tobacco exposure. Current diabetic treatment includes insulin injections. Her weight is fluctuating minimally. She is following a generally unhealthy diet. When asked  about meal planning, she reported none. She has not had a previous visit with a dietitian. She rarely participates in exercise. Her home blood glucose trend is decreasing steadily. Her breakfast blood glucose range is generally 140-180 mg/dl. Her lunch blood glucose range is generally 140-180 mg/dl. Her dinner blood glucose range is generally 140-180 mg/dl. Her bedtime blood glucose range is generally 140-180 mg/dl. Her overall blood glucose range is 140-180 mg/dl. (She presents with continued improvement in her glycemic profile with her Dexcom showing 66% time range, 24% level 1 hyperglycemia, 8% level 2 hyperglycemia.  She has been on 2% Level One hypoglycemia.  Her point-of-care A1c is 6%, progressively improving.      ) An ACE inhibitor/angiotensin II receptor blocker is being taken. Eye exam is current.  Hyperlipidemia This is a chronic problem. The current episode started more than 1 year ago. The problem is controlled. Exacerbating diseases include diabetes. Associated symptoms include myalgias. Pertinent negatives include no chest pain or shortness of breath. Current antihyperlipidemic treatment includes statins. Risk factors for coronary artery disease include dyslipidemia, diabetes mellitus, hypertension, a sedentary lifestyle and post-menopausal.  Hypertension This is a chronic problem. The current episode started more than 1 year ago. Pertinent negatives include no blurred vision, chest pain, headaches, palpitations or shortness of breath. Risk factors for coronary artery disease include dyslipidemia, diabetes mellitus, smoking/tobacco exposure, sedentary lifestyle and post-menopausal state. Past treatments include angiotensin blockers. 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     Review of Systems  Constitutional:  Negative for chills, fatigue, fever and unexpected weight change.  HENT:  Negative for trouble swallowing and voice change.   Eyes:  Negative for blurred vision  and visual disturbance.  Respiratory:  Negative for cough, shortness of breath and wheezing.   Cardiovascular:  Negative for chest pain, palpitations and leg swelling.  Gastrointestinal:  Negative for diarrhea, nausea and vomiting.  Endocrine: Negative for cold intolerance, heat intolerance, polydipsia, polyphagia and polyuria.  Musculoskeletal:  Positive for arthralgias, back pain and myalgias.  Skin:  Negative for color change, pallor, rash and wound.  Neurological:  Negative for seizures and headaches.  Psychiatric/Behavioral:  Negative for confusion and suicidal ideas.     Objective:       06/04/2023    2:49 PM 01/28/2023    3:16 PM 09/27/2022    3:33 PM  Vitals with BMI  Height 5\' 4"  5\' 4"  5\' 4"   Weight 157 lbs 6 oz 163 lbs 6 oz 164 lbs  BMI 27 28.03 28.14  Systolic 118 116 213  Diastolic 56 52 64  Pulse 60 60 56    BP (!) 118/56   Pulse 60   Ht 5\' 4"  (1.626 m)   Wt 157 lb 6.4 oz (71.4 kg)   BMI 27.02 kg/m   Wt Readings from Last 3 Encounters:  06/04/23 157 lb 6.4 oz (71.4 kg)  01/28/23 163 lb 6.4 oz (74.1 kg)  09/27/22 164 lb (74.4 kg)      CMP ( most recent) CMP     Component Value Date/Time   NA 141 05/28/2023 0951   K 4.1 05/28/2023 0951   CL 106 05/28/2023 0951   CO2 22 05/28/2023 0951   GLUCOSE 136 (H) 05/28/2023 0951   GLUCOSE 245 (H) 12/25/2021 1533   BUN 41 (H) 05/28/2023 0951   CREATININE 1.13 (H) 05/28/2023 0951   CREATININE 0.78 12/25/2021 1533   CALCIUM 8.9 05/28/2023 0951   PROT 6.3 05/28/2023 0951   ALBUMIN 4.0 05/28/2023 0951   AST 30 05/28/2023 0951   ALT 27 05/28/2023 0951   ALKPHOS 118 05/28/2023 0951   BILITOT 0.3 05/28/2023 0951   GFRNONAA >60 08/29/2021 0000   GFRAA >60 08/29/2021 0000     Diabetic Labs (most recent): Lab Results  Component Value Date   HGBA1C 6.0 06/04/2023   HGBA1C 6.1 01/28/2023   HGBA1C 8.4 (A) 09/27/2022  Lipid Panel     Component Value Date/Time   CHOL 84 (L) 05/28/2023 0951   TRIG 98 05/28/2023  0951   HDL 31 (L) 05/28/2023 0951   CHOLHDL 2.7 05/28/2023 0951   LDLCALC  34 05/28/2023 0951   LABVLDL 19 05/28/2023 0951      Assessment & Plan:   1. DM type 2 causing vascular disease (HCC)  - Theresa Hernandez has currently uncontrolled symptomatic type 2 DM since  82 years of age.  She presents with continued improvement in her glycemic profile with her Dexcom showing 66% time range, 24% level 1 hyperglycemia, 8% level 2 hyperglycemia.  She has been on 2% Level One hypoglycemia.  Her point-of-care A1c is 6%, progressively improving.    - I had a long discussion with her about the progressive nature of diabetes and the pathology behind its complications. -her diabetes is complicated by coronary artery disease, CVA, several comorbidities.  And she remains at exceedingly high risk for more acute and chronic complications which include CAD, CVA, CKD, retinopathy, and neuropathy. These are all discussed in detail with her.  - I have counseled her on diet  and weight management  by adopting a whole food plant-based diet.   Patient is encouraged to switch to  unprocessed or minimally processed     complex starch and increased protein intake mostly plant source, fruits, and vegetables. -  she is advised to stick to a routine mealtimes to eat 3 meals  a day and avoid unnecessary snacks ( to snack only to correct hypoglycemia).   - she acknowledges that there is a room for improvement in her food and drink choices. - Suggestion is made for her to avoid simple carbohydrates  from her diet including Cakes, Sweet Desserts, Ice Cream, Soda (diet and regular), Sweet Tea, Candies, Chips, Cookies, Store Bought Juices, Alcohol , Artificial Sweeteners,  Coffee Creamer, and "Sugar-free" Products, Lemonade. This will help patient to have more stable blood glucose profile and potentially avoid unintended weight gain.  The following Lifestyle Medicine recommendations according to American College of Lifestyle  Medicine  [facility]) were discussed and and offered to patient and she  agrees to start the journey:  A. Whole Foods, Plant-Based Nutrition comprising of fruits and vegetables, plant-based proteins, whole-grain carbohydrates was discussed in detail with the patient.   A list for source of those nutrients were also provided to the patient.  Patient will use only water or unsweetened tea for hydration. B.  The need to stay away from risky substances including alcohol, smoking; obtaining 7 to 9 hours of restorative sleep, at least 150 minutes of moderate intensity exercise weekly, the importance of healthy social connections,  and stress management techniques were discussed. C.  A full color page of  Calorie density of various food groups per pound showing examples of each food groups was provided to the patient.   - she has been  scheduled with Norm Salt, RDN, CDE for diabetes education.  - I have approached her with the following individualized plan to manage  her diabetes and patient agrees:   She is benefiting from her current regimen of premixed NovoLog 70/30.    This patient worries about hypoglycemia.  She is advised to continue NovoLog 70/30  40 units with breakfast and 30 units with supper only when Premeal blood glucose readings are above 90 mg per DL.  She is advised to avoid injecting insulin when she is not eating. - she is warned not to take insulin without proper monitoring per orders. -She is advised to continue to use her Dexcom CGM.    - she is encouraged to call clinic for blood glucose levels less than 70 or above 200 mg /dl. -  she has normal renal function, however patient reports intolerance to Metformin.    -She would benefit from an SGLT2 inhibitor provide is on glipizide.  I discussed and prescribed Jardiance 10 mg p.o. daily at breakfast and advised to discontinue glipizide.   Plant-based, whole foods lifestyle nutrition was discussed and recommended for her.  -  Specific targets for  A1c;  LDL, HDL,  and Triglycerides were discussed with the patient.  2) Blood Pressure /Hypertension:  -Her blood pressure is controlled to target  she is advised to continue her current medications including losartan 100 mg p.o. daily with breakfast .  3) Lipids/Hyperlipidemia:   Her previsit fasting lipid panel showed controlled LDL at 34.  She is advised to continue atorvastatin currently at 80 mg p.o. nightly.  This patient would benefit from a lower dose of statin -on subsequent visit.  She is also on ezetimibe 10 mg p.o. daily, advised to continue.   Side effects and precautions discussed with her.    4)  Weight/Diet:  Body mass index is 27.02 kg/m.  -She is regaining the weight loss she has achieved prior to last visit.  I discussed with her the fact that loss of 5 - 10% of her  current body weight will have the most impact on her diabetes management.  Exercise, and detailed carbohydrates information provided  -  detailed on discharge instructions.  5) Chronic Care/Health Maintenance:  -she  is on ACEI/ARB and Statin medications and  is encouraged to initiate and continue to follow up with Ophthalmology, Dentist,  Podiatrist at least yearly or according to recommendations, and advised to   stay away from smoking. I have recommended yearly flu vaccine and pneumonia vaccine at least every 5 years; moderate intensity exercise for up to 150 minutes weekly; and  sleep for at least 7 hours a day.  Point-of-care ABI for PAD was normal in February 2022.  Her test will be repeated in February 2027 or sooner if needed.  - she is  advised to maintain close follow up with Pomposini, Rande Brunt, MD for primary care needs, as well as her other providers for optimal and coordinated care.   I spent  26  minutes in the care of the patient today including review of labs from CMP, Lipids, Thyroid Function, Hematology (current and previous including abstractions from other facilities);  face-to-face time discussing  her blood glucose readings/logs, discussing hypoglycemia and hyperglycemia episodes and symptoms, medications doses, her options of short and long term treatment based on the latest standards of care / guidelines;  discussion about incorporating lifestyle medicine;  and documenting the encounter. Risk reduction counseling performed per USPSTF guidelines to reduce  cardiovascular risk factors.     Please refer to Patient Instructions for Blood Glucose Monitoring and Insulin/Medications Dosing Guide"  in media tab for additional information. Please  also refer to " Patient Self Inventory" in the Media  tab for reviewed elements of pertinent patient history.  Joesph Fillers participated in the discussions, expressed understanding, and voiced agreement with the above plans.  All questions were answered to her satisfaction. she is encouraged to contact clinic should she have any questions or concerns prior to her return visit.    Follow up plan: - Return in about 6 months (around 12/02/2023) for Bring Meter/CGM Device/Logs- A1c in Office.  Marquis Lunch, MD [facility] [address] [place], [place] [address] Phone: [phone]  Fax: [phone]    06/04/2023, 4:41 PM  This note was partially dictated with voice recognition software. Similar sounding words can be transcribed inadequately or may not  be corrected upon review.

## 2023-06-27 ENCOUNTER — Ambulatory Visit (INDEPENDENT_AMBULATORY_CARE_PROVIDER_SITE_OTHER): Payer: Medicare HMO | Admitting: Gastroenterology

## 2023-07-11 ENCOUNTER — Ambulatory Visit (INDEPENDENT_AMBULATORY_CARE_PROVIDER_SITE_OTHER): Payer: Medicare HMO | Admitting: Gastroenterology

## 2023-07-18 ENCOUNTER — Ambulatory Visit (INDEPENDENT_AMBULATORY_CARE_PROVIDER_SITE_OTHER): Payer: Medicare HMO | Admitting: Gastroenterology

## 2023-08-27 ENCOUNTER — Other Ambulatory Visit: Payer: Self-pay | Admitting: "Endocrinology

## 2023-08-31 ENCOUNTER — Other Ambulatory Visit: Payer: Self-pay | Admitting: "Endocrinology

## 2023-08-31 DIAGNOSIS — E1159 Type 2 diabetes mellitus with other circulatory complications: Secondary | ICD-10-CM

## 2023-11-12 ENCOUNTER — Other Ambulatory Visit: Payer: Self-pay | Admitting: "Endocrinology

## 2023-11-12 DIAGNOSIS — E1159 Type 2 diabetes mellitus with other circulatory complications: Secondary | ICD-10-CM

## 2023-12-05 ENCOUNTER — Ambulatory Visit: Payer: Medicare HMO | Admitting: "Endocrinology

## 2023-12-05 ENCOUNTER — Encounter: Payer: Self-pay | Admitting: "Endocrinology

## 2023-12-05 VITALS — BP 134/52 | HR 56 | Ht 64.0 in | Wt 147.2 lb

## 2023-12-05 DIAGNOSIS — E782 Mixed hyperlipidemia: Secondary | ICD-10-CM | POA: Diagnosis not present

## 2023-12-05 DIAGNOSIS — I10 Essential (primary) hypertension: Secondary | ICD-10-CM

## 2023-12-05 DIAGNOSIS — E1159 Type 2 diabetes mellitus with other circulatory complications: Secondary | ICD-10-CM

## 2023-12-05 DIAGNOSIS — Z794 Long term (current) use of insulin: Secondary | ICD-10-CM

## 2023-12-05 LAB — POCT GLYCOSYLATED HEMOGLOBIN (HGB A1C): HbA1c, POC (controlled diabetic range): 6.9 % (ref 0.0–7.0)

## 2023-12-05 MED ORDER — INSULIN ASP PROT & ASP FLEXPEN (70-30) 100 UNIT/ML ~~LOC~~ SUPN: 30.0000 [IU] | PEN_INJECTOR | Freq: Two times a day (BID) | SUBCUTANEOUS | 1 refills | Status: DC

## 2023-12-05 MED ORDER — EMPAGLIFLOZIN 10 MG PO TABS
10.0000 mg | ORAL_TABLET | Freq: Every day | ORAL | 1 refills | Status: DC
Start: 2023-12-05 — End: 2024-05-15

## 2023-12-05 NOTE — Patient Instructions (Signed)

## 2023-12-06 NOTE — Progress Notes (Signed)
 12/06/2023, 1:04 PM  Endocrinology follow-up note   Subjective:    Patient ID: Theresa Hernandez, female    DOB: Mar 10, 1941.  Theresa Hernandez is being seen in follow up after she was seen in  consultation for management of currently her original consult was requested  by  Eldridge Abrahams, MD.   Past Medical History:  Diagnosis Date   Change in stool 05/09/2017   Diabetes (HCC)    Fibromyalgia    GERD (gastroesophageal reflux disease)    High cholesterol    Hypertension     Past Surgical History:  Procedure Laterality Date   bust reduction     CARPAL TUNNEL RELEASE     both   COLONOSCOPY WITH PROPOFOL N/A 03/13/2022   Procedure: COLONOSCOPY WITH PROPOFOL;  Surgeon: Dolores Frame, MD;  [facility]: [facility];  Service: Gastroenterology;  Laterality: N/A;  915 ASA 2   ESOPHAGEAL DILATION N/A 09/05/2015   Procedure: ESOPHAGEAL DILATION;  Surgeon: Malissa Hippo, MD;  Location: [facility];  Service: Endoscopy;  Laterality: N/A;   ESOPHAGOGASTRODUODENOSCOPY N/A 09/05/2015   Procedure: ESOPHAGOGASTRODUODENOSCOPY (EGD);  Surgeon: Malissa Hippo, MD;  Location: [facility];  Service: Endoscopy;  Laterality: N/A;  8:55   KNEE ARTHROSCOPY     LUMBAR EPIDURAL INJECTION     NASAL FRACTURE SURGERY     NECK SURGERY     POLYPECTOMY  03/13/2022   Procedure: POLYPECTOMY;  Surgeon: Marguerita Merles, Reuel Boom, MD;  Location: [facility];  Service: Gastroenterology;;   TUBAL LIGATION      Social History   Socioeconomic History   Marital status: Divorced    Spouse name: Not on file   Number of children: Not on file   Years of education: Not on file   Highest education level: Not on file  Occupational History   Not on file  Tobacco Use   Smoking status: Never   Smokeless tobacco: Never  Vaping Use   Vaping status: Never Used  Substance and Sexual Activity   Alcohol use: No     Alcohol/week: 0.0 standard drinks of alcohol   Drug use: No   Sexual activity: Not on file  Other Topics Concern   Not on file  Social History Narrative   Not on file   Social Drivers of Health   Financial Resource Strain: Not on file  Food Insecurity: Not on file  Transportation Needs: Not on file  Physical Activity: Not on file  Stress: Not on file  Social Connections: Not on file    History reviewed. No pertinent family history.  Outpatient Encounter Medications as of 12/05/2023  Medication Sig   empagliflozin (JARDIANCE) 10 MG TABS tablet Take 1 tablet (10 mg total) by mouth daily before breakfast.   ALPRAZolam (XANAX) 0.25 MG tablet Take 0.25 mg by mouth at bedtime as needed for anxiety.   Apple Cider Vinegar 600 MG CAPS Take 600 mg by mouth daily.   aspirin 81 MG tablet Take 81 mg by mouth daily.   atorvastatin (LIPITOR) 80 MG tablet Take 80 mg by mouth daily.   b complex  vitamins capsule Take 1 capsule by mouth daily.   Biotin 54098 MCG TABS Take 10,000 mcg by mouth daily at 6 (six) AM.   carbidopa-levodopa (SINEMET IR) 25-100 MG tablet Take 1 tablet by mouth 3 (three) times daily.   Cholecalciferol (VITAMIN D) 50 MCG (2000 UT) CAPS Take 2,000 Units by mouth daily.   Continuous Blood Gluc Receiver (DEXCOM G7 RECEIVER) DEVI Use to monitor BG continuously   Continuous Blood Gluc Sensor (DEXCOM G7 SENSOR) MISC Change sensor every 10 days   docusate sodium (COLACE) 100 MG capsule Take 100 mg by mouth daily.   ezetimibe (ZETIA) 10 MG tablet Take 10 mg by mouth daily.   glucose blood (ONETOUCH VERIO) test strip USE AS DIRECTED TWICE DAILY   hydrochlorothiazide (HYDRODIURIL) 25 MG tablet Take 25 mg by mouth daily.   Insulin Asp Prot & Asp FlexPen (NOVOLOG 70/30 MIX) (70-30) 100 UNIT/ML FlexPen Inject 30 Units into the skin 2 (two) times daily with a meal.   ipratropium (ATROVENT) 0.06 % nasal spray Place 2 sprays into both nostrils 4 (four) times daily as needed for rhinitis.    losartan (COZAAR) 100 MG tablet Take 100 mg by mouth daily.   Multiple Vitamins-Minerals (MULTIVITAMIN WITH MINERALS) tablet Take 1 tablet by mouth daily.   OVER THE COUNTER MEDICATION H.A. Joint Formula 3 tablets daily   sotalol (BETAPACE) 80 MG tablet Take 80 mg by mouth 2 (two) times daily.   XARELTO 20 MG TABS tablet Take 20 mg by mouth daily.   [DISCONTINUED] empagliflozin (JARDIANCE) 10 MG TABS tablet Take 1 tablet (10 mg total) by mouth daily before breakfast. (Patient not taking: Reported on 12/05/2023)   [DISCONTINUED] Insulin Asp Prot & Asp FlexPen (NOVOLOG 70/30 MIX) (70-30) 100 UNIT/ML FlexPen INJECT 40 UNITS UNDER SKIN WITH BREAKFAST AND 30 UNITS WITH SUPPER ONLY IF GLUCOSE READINGS ARE ABOVE 90 AND EATING. (Patient taking differently: 30 Units 2 (two) times daily with a meal. INJECT 40 UNITS UNDER SKIN WITH BREAKFAST AND 30 UNITS WITH SUPPER ONLY IF GLUCOSE READINGS ARE ABOVE 90 AND EATING.)   No facility-administered encounter medications on file as of 12/05/2023.    ALLERGIES: Allergies  Allergen Reactions   Ciprofloxacin Nausea And Vomiting    VACCINATION STATUS: There is no immunization history for the selected administration types on file for this patient.  Diabetes She presents for her follow-up diabetic visit. She has type 2 diabetes mellitus. Onset time: She was diagnosed at approximate age of 52 years. Her disease course has been fluctuating. There are no hypoglycemic associated symptoms. Pertinent negatives for hypoglycemia include no confusion, headaches, pallor or seizures. Pertinent negatives for diabetes include no blurred vision, no chest pain, no fatigue, no foot paresthesias, no polydipsia, no polyphagia and no polyuria. There are no hypoglycemic complications. Symptoms are stable. 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 Risk factors for coronary artery disease include dyslipidemia, diabetes mellitus, post-menopausal, sedentary lifestyle and tobacco  exposure. Current diabetic treatment includes insulin injections. Her weight is fluctuating minimally. She is following a generally unhealthy diet. When asked about meal planning, she reported none. She has not had a previous visit with a dietitian. She rarely participates in exercise. Her home blood glucose trend is fluctuating minimally. Her breakfast blood glucose range is generally 140-180 mg/dl. Her lunch blood glucose range is generally 140-180 mg/dl. Her dinner blood glucose range is generally 140-180 mg/dl. Her bedtime blood glucose range is generally 140-180 mg/dl. Her overall blood glucose range is 140-180 mg/dl. (She presents  with her CGM device showing 56% time range, 31% Level One hyperglycemia, 20% able to hyperglycemia.  Her average blood glucose is 175 mg per DL for the most recent 2 weeks.  Her point-of-care A1c is 6.9% increasing from 6% during her last visit.    ) An ACE inhibitor/angiotensin II receptor blocker is being taken. Eye exam is current.  Hyperlipidemia This is a chronic problem. The current episode started more than 1 year ago. The problem is controlled. Exacerbating diseases include diabetes. Associated symptoms include myalgias. Pertinent negatives include no chest pain or shortness of breath. Current antihyperlipidemic treatment includes statins. Risk factors for coronary artery disease include dyslipidemia, diabetes mellitus, hypertension, a sedentary lifestyle and post-menopausal.  Hypertension This is a chronic problem. The current episode started more than 1 year ago. Pertinent negatives include no blurred vision, chest pain, headaches, palpitations or shortness of breath. Risk factors for coronary artery disease include dyslipidemia, diabetes mellitus, smoking/tobacco exposure, sedentary lifestyle and post-menopausal state. Past treatments include angiotensin blockers. 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     Review of Systems  Constitutional:   Negative for chills, fatigue, fever and unexpected weight change.  HENT:  Negative for trouble swallowing and voice change.   Eyes:  Negative for blurred vision and visual disturbance.  Respiratory:  Negative for cough, shortness of breath and wheezing.   Cardiovascular:  Negative for chest pain, palpitations and leg swelling.  Gastrointestinal:  Negative for diarrhea, nausea and vomiting.  Endocrine: Negative for cold intolerance, heat intolerance, polydipsia, polyphagia and polyuria.  Musculoskeletal:  Positive for arthralgias, back pain and myalgias.  Skin:  Negative for color change, pallor, rash and wound.  Neurological:  Negative for seizures and headaches.  Psychiatric/Behavioral:  Negative for confusion and suicidal ideas.     Objective:       12/05/2023    2:06 PM 06/04/2023    2:49 PM 01/28/2023    3:16 PM  Vitals with BMI  Height 5\' 4"  5\' 4"  5\' 4"   Weight 147 lbs 3 oz 157 lbs 6 oz 163 lbs 6 oz  BMI 25.25 27 28.03  Systolic 134 118 604  Diastolic 52 56 52  Pulse 56 60 60    BP (!) 134/52   Pulse (!) 56   Ht 5\' 4"  (1.626 m)   Wt 147 lb 3.2 oz (66.8 kg)   BMI 25.27 kg/m   Wt Readings from Last 3 Encounters:  12/05/23 147 lb 3.2 oz (66.8 kg)  06/04/23 157 lb 6.4 oz (71.4 kg)  01/28/23 163 lb 6.4 oz (74.1 kg)      CMP ( most recent) CMP     Component Value Date/Time   NA 141 05/28/2023 0951   K 4.1 05/28/2023 0951   CL 106 05/28/2023 0951   CO2 22 05/28/2023 0951   GLUCOSE 136 (H) 05/28/2023 0951   GLUCOSE 245 (H) 12/25/2021 1533   BUN 41 (H) 05/28/2023 0951   CREATININE 1.13 (H) 05/28/2023 0951   CREATININE 0.78 12/25/2021 1533   CALCIUM 8.9 05/28/2023 0951   PROT 6.3 05/28/2023 0951   ALBUMIN 4.0 05/28/2023 0951   AST 30 05/28/2023 0951   ALT 27 05/28/2023 0951   ALKPHOS 118 05/28/2023 0951   BILITOT 0.3 05/28/2023 0951   GFRNONAA >60 08/29/2021 0000   GFRAA >60 08/29/2021 0000     Diabetic Labs (most recent): Lab Results  Component Value Date    HGBA1C 6.9 12/05/2023   HGBA1C 6.0 06/04/2023   HGBA1C 6.1  01/28/2023  Lipid Panel     Component Value Date/Time   CHOL 84 (L) 05/28/2023 0951   TRIG 98 05/28/2023 0951   HDL 31 (L) 05/28/2023 0951   CHOLHDL 2.7 05/28/2023 0951   LDLCALC 34 05/28/2023 0951   LABVLDL 19 05/28/2023 0951      Assessment & Plan:   1. DM type 2 causing vascular disease (HCC)  - Joesph Fillers has currently uncontrolled symptomatic type 2 DM since  83 years of age.  She presents with her CGM device showing 56% time range, 31% Level One hyperglycemia, 20% able to hyperglycemia.  Her average blood glucose is 175 mg per DL for the most recent 2 weeks.  Her point-of-care A1c is 6.9% increasing from 6% during her last visit.   - I had a long discussion with her about the progressive nature of diabetes and the pathology behind its complications. -her diabetes is complicated by coronary artery disease, CVA, several comorbidities.  And she remains at exceedingly high risk for more acute and chronic complications which include CAD, CVA, CKD, retinopathy, and neuropathy. These are all discussed in detail with her.  - I have counseled her on diet  and weight management  by adopting a whole food plant-based diet.   Patient is encouraged to switch to  unprocessed or minimally processed     complex starch and increased protein intake mostly plant source, fruits, and vegetables. -  she is advised to stick to a routine mealtimes to eat 3 meals  a day and avoid unnecessary snacks ( to snack only to correct hypoglycemia).   - she acknowledges that there is a room for improvement in her food and drink choices. - Suggestion is made for her to avoid simple carbohydrates  from her diet including Cakes, Sweet Desserts, Ice Cream, Soda (diet and regular), Sweet Tea, Candies, Chips, Cookies, Store Bought Juices, Alcohol , Artificial Sweeteners,  Coffee Creamer, and "Sugar-free" Products, Lemonade. This will help patient to have  more stable blood glucose profile and potentially avoid unintended weight gain.  The following Lifestyle Medicine recommendations according to American College of Lifestyle Medicine  [facility]) were discussed and and offered to patient and she  agrees to start the journey:  A. Whole Foods, Plant-Based Nutrition comprising of fruits and vegetables, plant-based proteins, whole-grain carbohydrates was discussed in detail with the patient.   A list for source of those nutrients were also provided to the patient.  Patient will use only water or unsweetened tea for hydration. B.  The need to stay away from risky substances including alcohol, smoking; obtaining 7 to 9 hours of restorative sleep, at least 150 minutes of moderate intensity exercise weekly, the importance of healthy social connections,  and stress management techniques were discussed. C.  A full color page of  Calorie density of various food groups per pound showing examples of each food groups was provided to the patient.   - she has been  scheduled with Norm Salt, RDN, CDE for diabetes education.  - I have approached her with the following individualized plan to manage  her diabetes and patient agrees:   She is benefiting from her current regimen of premixed NovoLog 70/30.    This patient worries about hypoglycemia.  She is advised to continue NovoLog 70/30 at 30 units with breakfast and 30 units with supper only when Premeal blood glucose readings are above 90 mg per DL.  She is advised to avoid injecting insulin when she is not eating. -  she is warned not to take insulin without proper monitoring per orders. -She is advised to continue to use her Dexcom CGM.    - she is encouraged to call clinic for blood glucose levels less than 70 or above 200 mg /dl. - she has normal renal function, however patient reports intolerance to Metformin.    -She never filled prescription for Jardiance for unclear reasons.  She has been using glipizide  intermittently to control random hyperglycemia.  I approached her for Jardiance again, agrees to get a prescription for Jardiance 10 mg p.o. daily at breakfast.  She would benefit from an SGLT2 inhibitor provide is on glipizide.  I discussed and prescribed Jardiance 10 mg p.o. daily at breakfast and advised to discontinue glipizide.   Plant-based, whole foods lifestyle nutrition was discussed and recommended for her.  - Specific targets for  A1c;  LDL, HDL,  and Triglycerides were discussed with the patient.  2) Blood Pressure /Hypertension:  -Her blood pressure is controlled to target.  she is advised to continue her current medications including losartan 100 mg p.o. daily with breakfast .  3) Lipids/Hyperlipidemia:   Her previsit fasting lipid panel showed controlled LDL at 34.  She is not benefiting and advised to continue atorvastatin 80 mg p.o. nightly.   She is also on ezetimibe 10 mg p.o. daily, advised to continue.   Side effects and precautions discussed with her.    4)  Weight/Diet:  Body mass index is 25.27 kg/m.  -She has achieved 17 pounds of weight loss overall since December 2023.  She is not a candidate for any major weight loss.  I discussed with her the fact that loss of 5 - 10% of her  current body weight will have the most impact on her diabetes management.  Exercise, and detailed carbohydrates information provided  -  detailed on discharge instructions.  5) Chronic Care/Health Maintenance:  -she  is on ACEI/ARB and Statin medications and  is encouraged to initiate and continue to follow up with Ophthalmology, Dentist,  Podiatrist at least yearly or according to recommendations, and advised to   stay away from smoking. I have recommended yearly flu vaccine and pneumonia vaccine at least every 5 years; moderate intensity exercise for up to 150 minutes weekly; and  sleep for at least 7 hours a day.  Point-of-care ABI for PAD was normal in February 2022.  Her test will be  repeated in February 2027 or sooner if needed.  - she is  advised to maintain close follow up with Pomposini, Rande Brunt, MD for primary care needs, as well as her other providers for optimal and coordinated care.   I spent  26  minutes in the care of the patient today including review of labs from CMP, Lipids, Thyroid Function, Hematology (current and previous including abstractions from other facilities); face-to-face time discussing  her blood glucose readings/logs, discussing hypoglycemia and hyperglycemia episodes and symptoms, medications doses, her options of short and long term treatment based on the latest standards of care / guidelines;  discussion about incorporating lifestyle medicine;  and documenting the encounter. Risk reduction counseling performed per USPSTF guidelines to reduce cardiovascular risk factors.     Please refer to Patient Instructions for Blood Glucose Monitoring and Insulin/Medications Dosing Guide"  in media tab for additional information. Please  also refer to " Patient Self Inventory" in the Media  tab for reviewed elements of pertinent patient history.  Joesph Fillers participated in the discussions, expressed understanding,  and voiced agreement with the above plans.  All questions were answered to her satisfaction. she is encouraged to contact clinic should she have any questions or concerns prior to her return visit.    Follow up plan: - Return in about 6 months (around 06/06/2024) for F/U with Pre-visit Labs, Meter/CGM/Logs, A1c here.  Marquis Lunch, MD [facility] [address] [place], [place] [address] Phone: [phone]  Fax: [phone]    12/06/2023, 1:04 PM  This note was partially dictated with voice recognition software. Similar sounding words can be transcribed inadequately or may not  be corrected upon review.

## 2023-12-24 ENCOUNTER — Other Ambulatory Visit: Payer: Self-pay | Admitting: "Endocrinology

## 2023-12-24 DIAGNOSIS — E1159 Type 2 diabetes mellitus with other circulatory complications: Secondary | ICD-10-CM

## 2024-04-02 ENCOUNTER — Other Ambulatory Visit: Payer: Self-pay | Admitting: "Endocrinology

## 2024-04-02 DIAGNOSIS — E1159 Type 2 diabetes mellitus with other circulatory complications: Secondary | ICD-10-CM

## 2024-05-15 ENCOUNTER — Other Ambulatory Visit: Payer: Self-pay | Admitting: "Endocrinology

## 2024-06-03 ENCOUNTER — Other Ambulatory Visit: Payer: Self-pay | Admitting: "Endocrinology

## 2024-06-03 DIAGNOSIS — E1159 Type 2 diabetes mellitus with other circulatory complications: Secondary | ICD-10-CM

## 2024-06-10 LAB — LAB REPORT - SCANNED: EGFR: 49

## 2024-06-18 ENCOUNTER — Ambulatory Visit: Admitting: "Endocrinology

## 2024-06-18 ENCOUNTER — Telehealth: Payer: Self-pay | Admitting: "Endocrinology

## 2024-06-18 NOTE — Telephone Encounter (Signed)
 [facility] is supposed to be faxing us  her results.

## 2024-06-18 NOTE — Telephone Encounter (Signed)
 Pt said she did labs at [facility], can you call and get those sent to us  and let me know so I can get her scheduled.

## 2024-06-19 NOTE — Telephone Encounter (Signed)
 This is Theresa Hernandez patient

## 2024-06-22 NOTE — Telephone Encounter (Signed)
Still waiting on results.

## 2024-07-03 ENCOUNTER — Other Ambulatory Visit: Payer: Self-pay

## 2024-07-03 DIAGNOSIS — E1159 Type 2 diabetes mellitus with other circulatory complications: Secondary | ICD-10-CM

## 2024-07-03 DIAGNOSIS — I10 Essential (primary) hypertension: Secondary | ICD-10-CM

## 2024-07-03 DIAGNOSIS — E782 Mixed hyperlipidemia: Secondary | ICD-10-CM

## 2024-07-13 ENCOUNTER — Ambulatory Visit: Admitting: "Endocrinology

## 2024-07-21 ENCOUNTER — Encounter: Payer: Self-pay | Admitting: "Endocrinology

## 2024-07-21 ENCOUNTER — Ambulatory Visit: Admitting: "Endocrinology

## 2024-07-21 VITALS — BP 136/74 | HR 52 | Ht 64.0 in | Wt 149.8 lb

## 2024-07-21 DIAGNOSIS — E1159 Type 2 diabetes mellitus with other circulatory complications: Secondary | ICD-10-CM

## 2024-07-21 DIAGNOSIS — E782 Mixed hyperlipidemia: Secondary | ICD-10-CM | POA: Diagnosis not present

## 2024-07-21 DIAGNOSIS — Z794 Long term (current) use of insulin: Secondary | ICD-10-CM

## 2024-07-21 DIAGNOSIS — I10 Essential (primary) hypertension: Secondary | ICD-10-CM

## 2024-07-21 LAB — POCT GLYCOSYLATED HEMOGLOBIN (HGB A1C): HbA1c, POC (controlled diabetic range): 8 % — AB (ref 0.0–7.0)

## 2024-07-21 MED ORDER — NOVOLOG MIX 70/30 FLEXPEN (70-30) 100 UNIT/ML ~~LOC~~ SUPN: 40.0000 [IU] | PEN_INJECTOR | Freq: Two times a day (BID) | SUBCUTANEOUS | 2 refills | Status: DC

## 2024-07-21 MED ORDER — GLIPIZIDE ER 2.5 MG PO TB24: 2.5000 mg | ORAL_TABLET | Freq: Every day | ORAL | 1 refills | Status: DC

## 2024-07-21 MED ORDER — TIRZEPATIDE 2.5 MG/0.5ML ~~LOC~~ SOAJ: 2.5000 mg | SUBCUTANEOUS | 0 refills | Status: AC

## 2024-07-21 NOTE — Patient Instructions (Signed)

## 2024-07-21 NOTE — Progress Notes (Signed)
 07/21/2024, 2:30 PM  Endocrinology follow-up note   Subjective:    Patient ID: Theresa Hernandez, female    DOB: [birth year].  Theresa Hernandez is being seen in follow up after she was seen in  consultation for management of currently her original consult was requested  by  Sharla Toribio CROME, MD.   Past Medical History:  Diagnosis Date   Change in stool 05/09/2017   Diabetes (HCC)    Fibromyalgia    GERD (gastroesophageal reflux disease)    High cholesterol    Hypertension     Past Surgical History:  Procedure Laterality Date   bust reduction     CARPAL TUNNEL RELEASE     both   COLONOSCOPY WITH PROPOFOL  N/A 03/13/2022   Procedure: COLONOSCOPY WITH PROPOFOL ;  Surgeon: Eartha Angelia Toribio, MD;  Location: [facility];  Service: Gastroenterology;  Laterality: N/A;  915 ASA 2   ESOPHAGEAL DILATION N/A 09/05/2015   Procedure: ESOPHAGEAL DILATION;  Surgeon: Claudis RAYMOND Rivet, MD;  Location: [facility];  Service: Endoscopy;  Laterality: N/A;   ESOPHAGOGASTRODUODENOSCOPY N/A 09/05/2015   Procedure: ESOPHAGOGASTRODUODENOSCOPY (EGD);  Surgeon: Claudis RAYMOND Rivet, MD;  Location: [facility];  Service: Endoscopy;  Laterality: N/A;  8:55   KNEE ARTHROSCOPY     LUMBAR EPIDURAL INJECTION     NASAL FRACTURE SURGERY     NECK SURGERY     POLYPECTOMY  03/13/2022   Procedure: POLYPECTOMY;  Surgeon: Eartha Angelia, Toribio, MD;  Location: [facility];  Service: Gastroenterology;;   TUBAL LIGATION      Social History   Socioeconomic History   Marital status: Divorced    Spouse name: Not on file   Number of children: Not on file   Years of education: Not on file   Highest education level: Not on file  Occupational History   Not on file  Tobacco Use   Smoking status: Never   Smokeless tobacco: Never  Vaping Use   Vaping status: Never Used  Substance and Sexual Activity   Alcohol use: No     Alcohol/week: 0.0 standard drinks of alcohol   Drug use: No   Sexual activity: Not on file  Other Topics Concern   Not on file  Social History Narrative   Not on file   Social Drivers of Health   Financial Resource Strain: Not on file  Food Insecurity: Not on file  Transportation Needs: Not on file  Physical Activity: Not on file  Stress: Not on file  Social Connections: Not on file    History reviewed. No pertinent family history.  Outpatient Encounter Medications as of 07/21/2024  Medication Sig   gabapentin (NEURONTIN) 300 MG capsule Take 300 mg by mouth 3 (three) times daily.   glipiZIDE  (GLUCOTROL  XL) 2.5 MG 24 hr tablet Take 1 tablet (2.5 mg total) by mouth daily with breakfast.   tirzepatide (MOUNJARO) 2.5 MG/0.5ML Pen Inject 2.5 mg into the skin once a week.   ALPRAZolam (XANAX) 0.25 MG tablet Take 0.25 mg by mouth at bedtime as needed for anxiety.   Apple Cider Vinegar 600 MG CAPS Take  600 mg by mouth daily.   aspirin 81 MG tablet Take 81 mg by mouth daily.   atorvastatin (LIPITOR) 80 MG tablet Take 80 mg by mouth daily.   b complex vitamins capsule Take 1 capsule by mouth daily.   Biotin 89999 MCG TABS Take 10,000 mcg by mouth daily at 6 (six) AM.   carbidopa-levodopa (SINEMET IR) 25-100 MG tablet Take 1 tablet by mouth 3 (three) times daily.   Cholecalciferol (VITAMIN D) 50 MCG (2000 UT) CAPS Take 2,000 Units by mouth daily.   Continuous Blood Gluc Receiver (DEXCOM G7 RECEIVER) DEVI Use to monitor BG continuously   Continuous Blood Gluc Sensor (DEXCOM G7 SENSOR) MISC Change sensor every 10 days   docusate sodium (COLACE) 100 MG capsule Take 100 mg by mouth daily.   ezetimibe (ZETIA) 10 MG tablet Take 10 mg by mouth daily.   glucose blood (ONETOUCH VERIO) test strip USE AS DIRECTED TWICE DAILY   hydrochlorothiazide (HYDRODIURIL) 25 MG tablet Take 25 mg by mouth daily.   insulin  aspart protamine - aspart (NOVOLOG  MIX 70/30 FLEXPEN) (70-30) 100 UNIT/ML FlexPen Inject 40  Units into the skin 2 (two) times daily with a meal. ADMINISTER 40 UNITS UNDER THE SKIN WITH BREAKFAST AND WITH SUPPER   ipratropium (ATROVENT) 0.06 % nasal spray Place 2 sprays into both nostrils 4 (four) times daily as needed for rhinitis.   JARDIANCE  10 MG TABS tablet TAKE 1 TABLET(10 MG) BY MOUTH DAILY BEFORE BREAKFAST (Patient not taking: Reported on 07/21/2024)   losartan (COZAAR) 100 MG tablet Take 100 mg by mouth daily.   Multiple Vitamins-Minerals (MULTIVITAMIN WITH MINERALS) tablet Take 1 tablet by mouth daily.   OVER THE COUNTER MEDICATION H.A. Joint Formula 3 tablets daily   sotalol (BETAPACE) 80 MG tablet Take 80 mg by mouth 2 (two) times daily.   XARELTO 20 MG TABS tablet Take 20 mg by mouth daily.   [DISCONTINUED] NOVOLOG  MIX 70/30 FLEXPEN (70-30) 100 UNIT/ML FlexPen ADMINISTER 30 UNITS UNDER THE SKIN WITH BREAKFAST AND WITH SUPPER   No facility-administered encounter medications on file as of 07/21/2024.    ALLERGIES: Allergies  Allergen Reactions   Ciprofloxacin Nausea And Vomiting   Jardiance  [Empagliflozin ] Rash    VACCINATION STATUS: Immunization History  Administered Date(s) Administered   PFIZER(Purple Top)SARS-COV-2 Vaccination 07/29/2020, 08/19/2020    Diabetes She presents for her follow-up diabetic visit. She has type 2 diabetes mellitus. Onset time: She was diagnosed at approximate age of 52 years. Her disease course has been fluctuating. There are no hypoglycemic associated symptoms. Pertinent negatives for hypoglycemia include no confusion, headaches, pallor or seizures. Pertinent negatives for diabetes include no blurred vision, no chest pain, no fatigue, no foot paresthesias, no polydipsia, no polyphagia and no polyuria. There are no hypoglycemic complications. Symptoms are stable. 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 Risk factors for coronary artery disease include dyslipidemia, diabetes mellitus, post-menopausal, sedentary lifestyle and  tobacco exposure. Current diabetic treatment includes insulin  injections. Her weight is fluctuating minimally. She is following a generally unhealthy diet. When asked about meal planning, she reported none. She has not had a previous visit with a dietitian. She rarely participates in exercise. Her home blood glucose trend is increasing steadily. Her breakfast blood glucose range is generally >200 mg/dl. Her lunch blood glucose range is generally >200 mg/dl. Her dinner blood glucose range is generally >200 mg/dl. Her bedtime blood glucose range is generally >200 mg/dl. Her overall blood glucose range is >200 mg/dl. (She presents with her  CGM device showing significantly above target glycemic profile averaging 243 for the most recent 14 days.  Her point-of-care A1c is 8% increasing from 6.9% during her last visit.  She has 30% time in range, 24% level 1 hyperglycemia, 46% Libre to hyperglycemia.  She has no hypoglycemia.  She did not tolerate Jardiance .     ) An ACE inhibitor/angiotensin II receptor blocker is being taken. Eye exam is current.  Hyperlipidemia This is a chronic problem. The current episode started more than 1 year ago. The problem is controlled. Exacerbating diseases include diabetes. Associated symptoms include myalgias. Pertinent negatives include no chest pain or shortness of breath. Current antihyperlipidemic treatment includes statins. Risk factors for coronary artery disease include dyslipidemia, diabetes mellitus, hypertension, a sedentary lifestyle and post-menopausal.  Hypertension This is a chronic problem. The current episode started more than 1 year ago. Pertinent negatives include no blurred vision, chest pain, headaches, palpitations or shortness of breath. Risk factors for coronary artery disease include dyslipidemia, diabetes mellitus, smoking/tobacco exposure, sedentary lifestyle and post-menopausal state. Past treatments include angiotensin blockers. 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     Objective:       07/21/2024    1:08 PM 12/05/2023    2:06 PM 06/04/2023    2:49 PM  Vitals with BMI  Height 5' 4 5' 4 5' 4  Weight 149 lbs 13 oz 147 lbs 3 oz 157 lbs 6 oz  BMI 25.7 25.25 27  Systolic 136 134 881  Diastolic 74 52 56  Pulse 52 56 60    BP 136/74   Pulse (!) 52   Ht 5' 4 (1.626 m)   Wt 149 lb 12.8 oz (67.9 kg)   BMI 25.71 kg/m   Wt Readings from Last 3 Encounters:  07/21/24 149 lb 12.8 oz (67.9 kg)  12/05/23 147 lb 3.2 oz (66.8 kg)  06/04/23 157 lb 6.4 oz (71.4 kg)      CMP ( most recent) CMP     Component Value Date/Time   NA 141 05/28/2023 0951   K 4.1 05/28/2023 0951   CL 106 05/28/2023 0951   CO2 22 05/28/2023 0951   GLUCOSE 136 (H) 05/28/2023 0951   GLUCOSE 245 (H) 12/25/2021 1533   BUN 41 (H) 05/28/2023 0951   CREATININE 1.13 (H) 05/28/2023 0951   CREATININE 0.78 12/25/2021 1533   CALCIUM 8.9 05/28/2023 0951   PROT 6.3 05/28/2023 0951   ALBUMIN 4.0 05/28/2023 0951   AST 30 05/28/2023 0951   ALT 27 05/28/2023 0951   ALKPHOS 118 05/28/2023 0951   BILITOT 0.3 05/28/2023 0951   GFRNONAA >60 08/29/2021 0000   GFRAA >60 08/29/2021 0000     Diabetic Labs (most recent): Lab Results  Component Value Date   HGBA1C 8.0 (A) 07/21/2024   HGBA1C 6.9 12/05/2023   HGBA1C 6.0 06/04/2023  Lipid Panel     Component Value Date/Time   CHOL 84 (L) 05/28/2023 0951   TRIG 98 05/28/2023 0951   HDL 31 (L) 05/28/2023 0951   CHOLHDL 2.7 05/28/2023 0951   LDLCALC 34 05/28/2023 0951   LABVLDL 19 05/28/2023 0951      Assessment & Plan:   1. DM type 2 causing vascular disease (HCC)  - Rock Bill has currently uncontrolled symptomatic type 2 DM since  83 years of age.  She presents with her CGM device showing significantly above target glycemic profile averaging 243 for the most recent 14 days.  Her point-of-care A1c is 8% increasing  from 6.9% during her last visit.  She has 30% time in range, 24% level 1  hyperglycemia, 46% Libre to hyperglycemia.  She has no hypoglycemia.  She did not tolerate Jardiance .   - I had a long discussion with her about the progressive nature of diabetes and the pathology behind its complications. -her diabetes is complicated by coronary artery disease, CVA, several comorbidities.  And she remains at exceedingly high risk for more acute and chronic complications which include CAD, CVA, CKD, retinopathy, and neuropathy. These are all discussed in detail with her.  - I have counseled her on diet  and weight management  by adopting a whole food plant-based diet.   Patient is encouraged to switch to  unprocessed or minimally processed     complex starch and increased protein intake mostly plant source, fruits, and vegetables. -  she is advised to stick to a routine mealtimes to eat 3 meals  a day and avoid unnecessary snacks ( to snack only to correct hypoglycemia).   - she acknowledges that there is a room for improvement in her food and drink choices. - Suggestion is made for her to avoid simple carbohydrates  from her diet including Cakes, Sweet Desserts, Ice Cream, Soda (diet and regular), Sweet Tea, Candies, Chips, Cookies, Store Bought Juices, Alcohol , Artificial Sweeteners,  Coffee Creamer, and Sugar-free Products, Lemonade. This will help patient to have more stable blood glucose profile and potentially avoid unintended weight gain.   - I have approached her with the following individualized plan to manage  her diabetes and patient agrees:    This patient worries about hypoglycemia.  In light of her presentation with above target glycemic profile, she is advised to increase her NovoLog  70/30 to 40 units with breakfast and 40 units with supper  only when Premeal blood glucose readings are above 90 mg per DL.  She is advised to avoid injecting insulin  when she is not eating. - she is warned not to take insulin  without proper monitoring per orders. -She is advised to  continue to use her Dexcom CGM.    - she is encouraged to call clinic for blood glucose levels less than 70 or above 200 mg /dl weekly average. -She did not tolerate Jardiance  which caused frequent UTI.  She was taken off of it. She is open for GLP-1 receptor agonist.  I did prescribe Mounjaro 2.5 mg subcutaneously weekly.  This medication will be advanced as she tolerates. She is also asking for  Glipizide .  Due to risk of hypoglycemia, she is initiated on lowest dose 2.5 mg XL p.o. daily at breakfast. -She reports intolerance to metformin.   - Specific targets for  A1c;  LDL, HDL,  and Triglycerides were discussed with the patient.  2) Blood Pressure /Hypertension:  Her blood pressure is controlled to target.  she is advised to continue her current medications including losartan 100 mg p.o. daily with breakfast .  3) Lipids/Hyperlipidemia:   Her previsit fasting lipid panel showed controlled LDL at 34.  She is not benefiting and advised to continue atorvastatin 80 mg p.o. nightly.    She is also on ezetimibe 10 mg p.o. daily, advised to continue.   Side effects and precautions discussed with her.    4)  Weight/Diet:  Body mass index is 25.71 kg/m.  -She has achieved 17 pounds of weight loss overall since December 2023.  She is not a candidate for any more major weight loss.    I  discussed with her the fact that loss of 5 - 10% of her  current body weight will have the most impact on her diabetes management.  Exercise, and detailed carbohydrates information provided  -  detailed on discharge instructions.  5) Chronic Care/Health Maintenance:  -she  is on ACEI/ARB and Statin medications and  is encouraged to initiate and continue to follow up with Ophthalmology, Dentist,  Podiatrist at least yearly or according to recommendations, and advised to   stay away from smoking. I have recommended yearly flu vaccine and pneumonia vaccine at least every 5 years; moderate intensity exercise for up to  150 minutes weekly; and  sleep for at least 7 hours a day.  Point-of-care ABI for PAD was normal in February 2022.  Her test will be repeated in February 2027 or sooner if needed.  - she is  advised to maintain close follow up with Pomposini, Toribio CROME, MD for primary care needs, as well as her other providers for optimal and coordinated care.   I spent  26  minutes in the care of the patient today including review of labs from CMP, Lipids, Thyroid Function, Hematology (current and previous including abstractions from other facilities); face-to-face time discussing  her blood glucose readings/logs, discussing hypoglycemia and hyperglycemia episodes and symptoms, medications doses, her options of short and long term treatment based on the latest standards of care / guidelines;  discussion about incorporating lifestyle medicine;  and documenting the encounter. Risk reduction counseling performed per USPSTF guidelines to reduce  cardiovascular risk factors.     Please refer to Patient Instructions for Blood Glucose Monitoring and Insulin /Medications Dosing Guide  in media tab for additional information. Please  also refer to  Patient Self Inventory in the Media  tab for reviewed elements of pertinent patient history.  Rock Bill participated in the discussions, expressed understanding, and voiced agreement with the above plans.  All questions were answered to her satisfaction. she is encouraged to contact clinic should she have any questions or concerns prior to her return visit.    Follow up plan: - Return in about 4 months (around 11/21/2024) for F/U with Pre-visit Labs, Meter/CGM/Logs, A1c here.  Ranny Earl, MD [facility] [address] [place], [place] [address] Phone: [phone]  Fax: [phone]    07/21/2024, 2:30 PM  This note was partially dictated with voice recognition software. Similar sounding words can be transcribed  inadequately or may not  be corrected upon review.

## 2024-08-06 ENCOUNTER — Encounter (INDEPENDENT_AMBULATORY_CARE_PROVIDER_SITE_OTHER): Payer: Self-pay | Admitting: Gastroenterology

## 2024-08-13 ENCOUNTER — Other Ambulatory Visit: Payer: Self-pay | Admitting: "Endocrinology

## 2024-08-13 DIAGNOSIS — E1159 Type 2 diabetes mellitus with other circulatory complications: Secondary | ICD-10-CM

## 2024-10-14 ENCOUNTER — Other Ambulatory Visit: Payer: Self-pay | Admitting: "Endocrinology

## 2024-11-01 DEATH — deceased

## 2024-11-24 ENCOUNTER — Ambulatory Visit: Admitting: "Endocrinology
